# Patient Record
Sex: Female | Born: 1965
Health system: Southern US, Community
[De-identification: ages and names within clinical notes are randomized; demographics above are authoritative.]

## PROBLEM LIST (undated history)

## (undated) DIAGNOSIS — F32A Depression, unspecified: Secondary | ICD-10-CM

## (undated) DIAGNOSIS — K219 Gastro-esophageal reflux disease without esophagitis: Secondary | ICD-10-CM

## (undated) DIAGNOSIS — R32 Unspecified urinary incontinence: Secondary | ICD-10-CM

## (undated) DIAGNOSIS — R51 Headache: Secondary | ICD-10-CM

## (undated) DIAGNOSIS — R519 Headache, unspecified: Secondary | ICD-10-CM

## (undated) DIAGNOSIS — F329 Major depressive disorder, single episode, unspecified: Secondary | ICD-10-CM

## (undated) DIAGNOSIS — IMO0002 Reserved for concepts with insufficient information to code with codable children: Secondary | ICD-10-CM

## (undated) DIAGNOSIS — M329 Systemic lupus erythematosus, unspecified: Secondary | ICD-10-CM

## (undated) DIAGNOSIS — Z86010 Personal history of colonic polyps: Secondary | ICD-10-CM

## (undated) DIAGNOSIS — G4730 Sleep apnea, unspecified: Secondary | ICD-10-CM

## (undated) DIAGNOSIS — T7840XA Allergy, unspecified, initial encounter: Secondary | ICD-10-CM

## (undated) DIAGNOSIS — F419 Anxiety disorder, unspecified: Secondary | ICD-10-CM

## (undated) HISTORY — PX: ANKLE SURGERY: SHX546

## (undated) HISTORY — DX: Allergy, unspecified, initial encounter: T78.40XA

## (undated) HISTORY — DX: Personal history of colonic polyps: Z86.010

## (undated) HISTORY — DX: Headache, unspecified: R51.9

## (undated) HISTORY — PX: FOOT SURGERY: SHX648

## (undated) HISTORY — PX: WISDOM TOOTH EXTRACTION: SHX21

## (undated) HISTORY — DX: Unspecified urinary incontinence: R32

## (undated) HISTORY — DX: Systemic lupus erythematosus, unspecified: M32.9

## (undated) HISTORY — DX: Major depressive disorder, single episode, unspecified: F32.9

## (undated) HISTORY — PX: CHOLECYSTECTOMY: SHX55

## (undated) HISTORY — DX: Anxiety disorder, unspecified: F41.9

## (undated) HISTORY — PX: EYE SURGERY: SHX253

## (undated) HISTORY — PX: MOUTH SURGERY: SHX715

## (undated) HISTORY — DX: Depression, unspecified: F32.A

## (undated) HISTORY — DX: Headache: R51

## (undated) HISTORY — DX: Gastro-esophageal reflux disease without esophagitis: K21.9

## (undated) HISTORY — DX: Sleep apnea, unspecified: G47.30

## (undated) HISTORY — DX: Reserved for concepts with insufficient information to code with codable children: IMO0002

---

## 2004-09-26 ENCOUNTER — Emergency Department (HOSPITAL_COMMUNITY): Admission: EM | Admit: 2004-09-26 | Discharge: 2004-09-26 | Payer: Self-pay | Admitting: Family Medicine

## 2004-10-05 ENCOUNTER — Ambulatory Visit: Payer: Self-pay | Admitting: Pulmonary Disease

## 2005-04-11 ENCOUNTER — Emergency Department (HOSPITAL_COMMUNITY): Admission: EM | Admit: 2005-04-11 | Discharge: 2005-04-11 | Payer: Self-pay | Admitting: Family Medicine

## 2007-12-30 ENCOUNTER — Emergency Department (HOSPITAL_COMMUNITY): Admission: EM | Admit: 2007-12-30 | Discharge: 2007-12-30 | Payer: Self-pay | Admitting: Family Medicine

## 2008-05-14 ENCOUNTER — Emergency Department (HOSPITAL_COMMUNITY): Admission: EM | Admit: 2008-05-14 | Discharge: 2008-05-14 | Payer: Self-pay | Admitting: Family Medicine

## 2008-05-20 ENCOUNTER — Encounter: Admission: RE | Admit: 2008-05-20 | Discharge: 2008-05-20 | Payer: Self-pay | Admitting: Orthopedic Surgery

## 2010-03-16 ENCOUNTER — Encounter: Admission: RE | Admit: 2010-03-16 | Discharge: 2010-03-16 | Payer: Self-pay | Admitting: Obstetrics and Gynecology

## 2010-09-07 ENCOUNTER — Encounter: Admission: RE | Admit: 2010-09-07 | Discharge: 2010-09-07 | Payer: Self-pay | Admitting: Obstetrics and Gynecology

## 2011-07-22 LAB — POCT I-STAT, CHEM 8
BUN: 6
Calcium, Ion: 0.93 — ABNORMAL LOW
Chloride: 105
Creatinine, Ser: 0.7

## 2011-07-22 LAB — D-DIMER, QUANTITATIVE: D-Dimer, Quant: 0.48

## 2011-09-25 ENCOUNTER — Encounter: Payer: Self-pay | Admitting: *Deleted

## 2011-09-25 ENCOUNTER — Emergency Department (HOSPITAL_COMMUNITY)
Admission: EM | Admit: 2011-09-25 | Discharge: 2011-09-25 | Disposition: A | Discharge status: Home / Self Care | Payer: Self-pay | Source: Home / Self Care | Attending: Emergency Medicine | Admitting: Emergency Medicine

## 2011-09-25 DIAGNOSIS — J069 Acute upper respiratory infection, unspecified: Secondary | ICD-10-CM

## 2011-09-25 DIAGNOSIS — J029 Acute pharyngitis, unspecified: Secondary | ICD-10-CM

## 2011-09-25 NOTE — ED Provider Notes (Signed)
History     CSN: [id number] Arrival date & time: 09/25/2011  9:22 AM   First MD Initiated Contact with Patient 09/25/11 [phone]      Chief Complaint  Patient presents with  . Sore Throat    pt with onset of sorethroat thursday - no improvement taking otc meds     (Consider location/radiation/quality/duration/timing/severity/associated sxs/prior treatment) Patient is a 45 y.o. female presenting with pharyngitis. The history is provided by the patient.  Sore Throat This is a new problem. Episode onset: 4 days ago. The problem occurs constantly. The problem has been gradually worsening. Pertinent negatives include no headaches and no shortness of breath. Associated symptoms comments: Associated with nasal congestion, mild cough, ear pressure. The symptoms are aggravated by swallowing. The symptoms are relieved by nothing. Treatments tried: otc cold medicine. The treatment provided no relief.    History reviewed. No pertinent past medical history.  Past Surgical History  Procedure Date  . Cholecystectomy     History reviewed. No pertinent family history.  History  Substance Use Topics  . Smoking status: Not on file  . Smokeless tobacco: Not on file  . Alcohol Use:     OB History    Grav Para Term Preterm Abortions TAB SAB Ect Mult Living                  Review of Systems  Constitutional: Negative for fever and chills.  HENT: Positive for ear pain, congestion, sore throat, rhinorrhea and postnasal drip. Negative for sinus pressure.   Respiratory: Positive for cough. Negative for shortness of breath and wheezing.   Neurological: Negative for headaches.    Allergies  Review of patient's allergies indicates no known allergies.  Home Medications   Current Outpatient Rx  Name Route Sig Dispense Refill  . GUAIFENESIN ER 600 MG PO TB12 Oral Take 1,200 mg by mouth 2 (two) times daily.      . IBUPROFEN 400 MG PO TABS Oral Take 400 mg by mouth every 6 (six) hours as needed.       Marland Kitchen OVER THE COUNTER MEDICATION  Flu med     . PRESCRIPTION MEDICATION  Birth control pill       BP 154/95  Pulse 86  Temp(Src) 98.2 F (36.8 C) (Oral)  Resp 17  SpO2 99%  LMP 09/04/2011  Physical Exam  Constitutional: She appears well-developed and well-nourished. No distress.  HENT:  Right Ear: External ear and ear canal normal. Tympanic membrane is retracted.  Left Ear: External ear and ear canal normal. Tympanic membrane is retracted.  Nose: Mucosal edema and rhinorrhea present.  Mouth/Throat: Mucous membranes are normal. Posterior oropharyngeal erythema present. No oropharyngeal exudate or posterior oropharyngeal edema.  Cardiovascular: Normal rate and regular rhythm.   Pulmonary/Chest: Effort normal and breath sounds normal.  Lymphadenopathy:       Head (right side): Submandibular adenopathy present.       Head (left side): Submandibular adenopathy present.  Skin: Skin is warm and dry. No rash noted.    ED Course  Procedures (including critical care time)  Labs Reviewed - No data to display No results found.   No diagnosis found.    MDM            Cathlyn Parsons, NP 09/25/11 1004

## 2011-09-30 NOTE — ED Provider Notes (Signed)
Dx Jane Valdez, pharyngitis Medical screening examination/treatment/procedure(s) were performed by non-physician practitioner and as supervising physician I was immediately available for consultation/collaboration.  Luiz Blare MD   Luiz Blare, MD 09/30/11 [phone]

## 2014-02-28 ENCOUNTER — Ambulatory Visit (INDEPENDENT_AMBULATORY_CARE_PROVIDER_SITE_OTHER): Payer: 59 | Admitting: Family Medicine

## 2014-02-28 ENCOUNTER — Encounter: Payer: Self-pay | Admitting: Family Medicine

## 2014-02-28 VITALS — BP 120/82 | HR 97 | Temp 98.0°F | Ht 64.0 in | Wt 196.8 lb

## 2014-02-28 DIAGNOSIS — Z136 Encounter for screening for cardiovascular disorders: Secondary | ICD-10-CM

## 2014-02-28 DIAGNOSIS — K219 Gastro-esophageal reflux disease without esophagitis: Secondary | ICD-10-CM | POA: Insufficient documentation

## 2014-02-28 DIAGNOSIS — Z6833 Body mass index (BMI) 33.0-33.9, adult: Secondary | ICD-10-CM | POA: Insufficient documentation

## 2014-02-28 DIAGNOSIS — IMO0002 Reserved for concepts with insufficient information to code with codable children: Secondary | ICD-10-CM

## 2014-02-28 DIAGNOSIS — G4700 Insomnia, unspecified: Secondary | ICD-10-CM

## 2014-02-28 DIAGNOSIS — Z0000 Encounter for general adult medical examination without abnormal findings: Secondary | ICD-10-CM | POA: Insufficient documentation

## 2014-02-28 DIAGNOSIS — F3289 Other specified depressive episodes: Secondary | ICD-10-CM

## 2014-02-28 DIAGNOSIS — F17200 Nicotine dependence, unspecified, uncomplicated: Secondary | ICD-10-CM

## 2014-02-28 DIAGNOSIS — F32A Depression, unspecified: Secondary | ICD-10-CM

## 2014-02-28 DIAGNOSIS — E669 Obesity, unspecified: Secondary | ICD-10-CM

## 2014-02-28 DIAGNOSIS — F329 Major depressive disorder, single episode, unspecified: Secondary | ICD-10-CM

## 2014-02-28 DIAGNOSIS — Z720 Tobacco use: Secondary | ICD-10-CM

## 2014-02-28 DIAGNOSIS — M329 Systemic lupus erythematosus, unspecified: Secondary | ICD-10-CM

## 2014-02-28 LAB — COMPREHENSIVE METABOLIC PANEL
ALK PHOS: 77 U/L (ref 39–117)
ALT: 14 U/L (ref 0–35)
AST: 16 U/L (ref 0–37)
Albumin: 4.1 g/dL (ref 3.5–5.2)
BILIRUBIN TOTAL: 0.6 mg/dL (ref 0.2–1.2)
BUN: 9 mg/dL (ref 6–23)
CO2: 24 mEq/L (ref 19–32)
Calcium: 9.2 mg/dL (ref 8.4–10.5)
Chloride: 103 mEq/L (ref 96–112)
Creat: 0.63 mg/dL (ref 0.50–1.10)
Glucose, Bld: 82 mg/dL (ref 70–99)
Potassium: 4.2 mEq/L (ref 3.5–5.3)
SODIUM: 136 meq/L (ref 135–145)
TOTAL PROTEIN: 6.7 g/dL (ref 6.0–8.3)

## 2014-02-28 LAB — CBC WITH DIFFERENTIAL/PLATELET
BASOS ABS: 0 10*3/uL (ref 0.0–0.1)
BASOS PCT: 0 % (ref 0–1)
EOS ABS: 0.1 10*3/uL (ref 0.0–0.7)
Eosinophils Relative: 1 % (ref 0–5)
HCT: 41 % (ref 36.0–46.0)
Hemoglobin: 14.3 g/dL (ref 12.0–15.0)
LYMPHS ABS: 2.5 10*3/uL (ref 0.7–4.0)
Lymphocytes Relative: 30 % (ref 12–46)
MCH: 32.9 pg (ref 26.0–34.0)
MCHC: 34.9 g/dL (ref 30.0–36.0)
MCV: 94.3 fL (ref 78.0–100.0)
Monocytes Absolute: 0.7 10*3/uL (ref 0.1–1.0)
Monocytes Relative: 9 % (ref 3–12)
NEUTROS PCT: 60 % (ref 43–77)
Neutro Abs: 4.9 10*3/uL (ref 1.7–7.7)
PLATELETS: 280 10*3/uL (ref 150–400)
RBC: 4.35 MIL/uL (ref 3.87–5.11)
RDW: 12.4 % (ref 11.5–15.5)
WBC: 8.2 10*3/uL (ref 4.0–10.5)

## 2014-02-28 LAB — LIPID PANEL
CHOL/HDL RATIO: 3 ratio
Cholesterol: 157 mg/dL (ref 0–200)
HDL: 52 mg/dL (ref >39)
LDL CALC: 90 mg/dL (ref 0–99)
Triglycerides: 76 mg/dL (ref <150)
VLDL: 15 mg/dL (ref 0–40)

## 2014-02-28 MED ORDER — VARENICLINE TARTRATE 0.5 MG X 11 & 1 MG X 42 PO MISC
ORAL | Status: DC
Start: 2014-02-28 — End: 2014-04-02

## 2014-02-28 MED ORDER — TRAZODONE HCL 50 MG PO TABS: 25.0000 mg | ORAL_TABLET | Freq: Every evening | ORAL | Status: DC | PRN

## 2014-02-28 NOTE — Assessment & Plan Note (Signed)
She will return to discuss this- wants to work on her diet and exercise.

## 2014-02-28 NOTE — Assessment & Plan Note (Signed)
Ready to quit! Discussed options.  She would like to try chantix.  Rx given.

## 2014-02-28 NOTE — Assessment & Plan Note (Signed)
  The problem of recurrent insomnia is discussed. Avoidance of caffeine sources is strongly encouraged. Sleep hygiene issues are reviewed.  Start Trazodone prn insomnia.

## 2014-02-28 NOTE — Progress Notes (Signed)
Pre visit review using our clinic review tool, if applicable. No additional management support is needed unless otherwise documented below in the visit note. 

## 2014-02-28 NOTE — Patient Instructions (Addendum)
Nice to meet you. We are trazodone 25-50 mg nightly as needed for sleep.  We will call you with your lab results and you can view them online.  I'm proud of you for wanting to quit smoking.

## 2014-02-28 NOTE — Assessment & Plan Note (Addendum)
Reviewed preventive care protocols, scheduled due services, and updated immunizations Discussed nutrition, exercise, diet, and healthy lifestyle.  Orders Placed This Encounter  Procedures  . Hepatitis C Antibody  . CBC with Differential  . Comprehensive metabolic panel  . Lipid panel  . TSH

## 2014-02-28 NOTE — Progress Notes (Signed)
Subjective:   Patient ID: ILEIGH METTLER, female    DOB: 1966-08-09, 48 y.o.   MRN: [id number]  LAJADA JANES is a pleasant 48 y.o. year old female who presents to clinic today with [place]  on 02/28/2014  HPI:  G2P2- would like CPX as well today. Has OBGYN- Dr. Harrington [facility]allenger [facility])- has appt in June.  Insomnia- for years, difficulty staying asleep.  No trouble falling asleep. Takes Melatonin, tylenol pm, benadryl prn insomnia.  They have minimal benefit. Tries lunesta in past, not helpful.  Does have some hip pain at night over past year or two. Does snore and wakes herself up.  Husband does not think she is not having apnea- she thinks it is her sinuses.  Lupus- per pt, only "skin lupus was ruled out in my blood." Dermatologist prescribes topical ointments (Dr. Allyson Sabal).   Tobacco abuse- 1/2 ppd.  Has tried Zyban in past.  She is ready to quit.  Obesity- wants to lose weight.  Admits to not being as active.  Patient Active Problem List   Diagnosis Date Noted  . Depression 02/28/2014  . Lupus 02/28/2014  . GERD (gastroesophageal reflux disease) 02/28/2014  . Insomnia 02/28/2014   Past Medical History  Diagnosis Date  . Depression   . Frequent headaches   . GERD (gastroesophageal reflux disease)   . Lupus     skin lupus   Past Surgical History  Procedure Laterality Date  . Cholecystectomy     History  Substance Use Topics  . Smoking status: Current Every Day Smoker  . Smokeless tobacco: Never Used  . Alcohol Use: Yes   Family History  Problem Relation Age of Onset  . Hypertension Mother   . Hyperlipidemia Father   . Heart disease Father   . Diabetes Paternal Grandmother    No Known Allergies Current Outpatient Prescriptions on File Prior to Visit  Medication Sig Dispense Refill  . guaiFENesin (MUCINEX) 600 MG 12 hr tablet Take 1,200 mg by mouth 2 (two) times daily.        Marland Kitchen ibuprofen (ADVIL,MOTRIN) 400 MG tablet Take 400 mg by mouth every 6 (six)  hours as needed.         No current facility-administered medications on file prior to visit.   The PMH, PSH, Social History, Family History, Medications, and allergies have been reviewed in [facility], and have been updated if relevant.   Review of Systems    See HPI Denies CP or SOB No anxiety or depression No blood in stool, no changes in bowel habits No n/v/d Objective:    BP 120/82  Pulse 97  Temp(Src) 98 F (36.7 C) (Oral)  Ht 5\' 4"  (1.626 m)  Wt 196 lb 12 oz (89.245 kg)  BMI 33.76 kg/m2  SpO2 97%   Physical Exam   General:  Well-developed,well-nourished,in no acute distress; alert,appropriate and cooperative throughout examination Head:  normocephalic and atraumatic.   Eyes:  vision grossly intact, pupils equal, pupils round, and pupils reactive to light.   Ears:  R ear normal and L ear normal.   Nose:  no external deformity.   Lungs:  Normal respiratory effort, chest expands symmetrically. Lungs are clear to auscultation, no crackles or wheezes. Heart:  Normal rate and regular rhythm. S1 and S2 normal without gallop, murmur, click, rub or other extra sounds. Abdomen:  Bowel sounds positive,abdomen soft and non-tender without masses, organomegaly or hernias noted. Msk:  No deformity or scoliosis noted of thoracic or  lumbar spine.   Extremities:  No clubbing, cyanosis, edema, or deformity noted with normal full range of motion of all joints.   Neurologic:  alert & oriented X3 and gait normal.   Skin:  Intact without suspicious lesions or rashes Cervical Nodes:  No lymphadenopathy noted Axillary Nodes:  No palpable lymphadenopathy Psych:  Cognition and judgment appear intact. Alert and cooperative with normal attention span and concentration. No apparent delusions, illusions, hallucinations       Assessment & Plan:   Depression  Lupus  GERD (gastroesophageal reflux disease)  Insomnia No Follow-up on file.

## 2014-03-01 ENCOUNTER — Telehealth: Payer: Self-pay | Admitting: Family Medicine

## 2014-03-01 LAB — HEPATITIS C ANTIBODY: HCV Ab: NEGATIVE

## 2014-03-01 LAB — TSH: TSH: 1.559 u[IU]/mL (ref 0.350–4.500)

## 2014-03-01 NOTE — Telephone Encounter (Signed)
Relevant patient education assigned to patient using Emmi. ° °

## 2014-03-03 ENCOUNTER — Encounter: Payer: Self-pay | Admitting: *Deleted

## 2014-03-27 ENCOUNTER — Other Ambulatory Visit: Payer: Self-pay | Admitting: Obstetrics and Gynecology

## 2014-03-27 DIAGNOSIS — R928 Other abnormal and inconclusive findings on diagnostic imaging of breast: Secondary | ICD-10-CM

## 2014-04-02 ENCOUNTER — Other Ambulatory Visit: Payer: Self-pay | Admitting: Family Medicine

## 2014-04-02 NOTE — Telephone Encounter (Signed)
Pt requesting medication refill. Pt estab 02/2014. pls advise

## 2014-04-03 ENCOUNTER — Other Ambulatory Visit: Payer: Self-pay | Admitting: *Deleted

## 2014-04-03 MED ORDER — VARENICLINE TARTRATE 1 MG PO TABS: 1.0000 mg | ORAL_TABLET | Freq: Two times a day (BID) | ORAL | Status: DC

## 2014-04-03 NOTE — Telephone Encounter (Signed)
Pharmacy received rx for Chantix starter pack. Pt had already finished starter pack and needs a new rx for the continuation therapy.

## 2014-04-04 ENCOUNTER — Ambulatory Visit
Admission: RE | Admit: 2014-04-04 | Discharge: 2014-04-04 | Disposition: A | Discharge status: Home / Self Care | Payer: 59 | Source: Ambulatory Visit | Attending: Obstetrics and Gynecology | Admitting: Obstetrics and Gynecology

## 2014-04-04 ENCOUNTER — Encounter (INDEPENDENT_AMBULATORY_CARE_PROVIDER_SITE_OTHER): Payer: Self-pay

## 2014-04-04 DIAGNOSIS — R928 Other abnormal and inconclusive findings on diagnostic imaging of breast: Secondary | ICD-10-CM

## 2014-04-28 ENCOUNTER — Encounter: Payer: Self-pay | Admitting: Family Medicine

## 2014-04-28 ENCOUNTER — Ambulatory Visit (INDEPENDENT_AMBULATORY_CARE_PROVIDER_SITE_OTHER): Payer: 59 | Admitting: Family Medicine

## 2014-04-28 VITALS — BP 118/76 | HR 88 | Temp 98.0°F | Ht 63.75 in | Wt 188.2 lb

## 2014-04-28 DIAGNOSIS — E669 Obesity, unspecified: Secondary | ICD-10-CM

## 2014-04-28 MED ORDER — PHENTERMINE HCL 15 MG PO CAPS: 15.0000 mg | ORAL_CAPSULE | ORAL | Status: DC

## 2014-04-28 NOTE — Progress Notes (Signed)
Pre visit review using our clinic review tool, if applicable. No additional management support is needed unless otherwise documented below in the visit note. 

## 2014-04-28 NOTE — Patient Instructions (Signed)
Great to see you. We will call you with a referral to nutritionist.  We are starting phentermine 15 mg every morning- come see me in 1 month.

## 2014-04-28 NOTE — Progress Notes (Signed)
   Subjective:   Patient ID: Jane Valdez, female    DOB: 04-Apr-1966, 48 y.o.   MRN: [id number]  Jane Valdez is a pleasant 48 y.o. year old female who presents to clinic today with Weight Loss  on 04/28/2014  HPI:  Has been having weight for years.  Since May, has been cutting calories- 1000 to 1200 calories a day.  Exercising- walking every day, 1 mile. Has lost weight.   Wt Readings from Last 3 Encounters:  04/28/14 188 lb 4 oz (85.39 kg)  02/28/14 196 lb 12 oz (89.245 kg)   Has tried "everything,"  Weight watchers, slim fast, metabolic weight loss.  Nothing has worked.  Current Outpatient Prescriptions on File Prior to Visit  Medication Sig Dispense Refill  . cycloSPORINE (RESTASIS) 0.05 % ophthalmic emulsion 1 drop 2 (two) times daily.      . fluocinonide (LIDEX) 0.05 % external solution Apply 1 application topically 2 (two) times daily.      Marland Kitchen guaiFENesin (MUCINEX) 600 MG 12 hr tablet Take 1,200 mg by mouth 2 (two) times daily.        Marland Kitchen ibuprofen (ADVIL,MOTRIN) 400 MG tablet Take 400 mg by mouth every 6 (six) hours as needed.        . norethindrone (MICRONOR,CAMILA,ERRIN) 0.35 MG tablet Take 1 tablet by mouth daily.      . traZODone (DESYREL) 50 MG tablet Take 0.5-1 tablets (25-50 mg total) by mouth at bedtime as needed for sleep.  30 tablet  3  . varenicline (CHANTIX CONTINUING MONTH PAK) 1 MG tablet Take 1 tablet (1 mg total) by mouth 2 (two) times daily.  60 tablet  3   No current facility-administered medications on file prior to visit.    No Known Allergies  Past Medical History  Diagnosis Date  . Depression   . Frequent headaches   . GERD (gastroesophageal reflux disease)   . Lupus     skin lupus    Past Surgical History  Procedure Laterality Date  . Cholecystectomy      Family History  Problem Relation Age of Onset  . Hypertension Mother   . Hyperlipidemia Father   . Heart disease Father   . Diabetes Paternal Grandmother     History   Social  History  . Marital Status: Married    Spouse Name: N/A    Number of Children: N/A  . Years of Education: N/A   Occupational History  . Not on file.   Social History Main Topics  . Smoking status: Current Some Day Smoker  . Smokeless tobacco: Never Used  . Alcohol Use: Yes  . Drug Use: No  . Sexual Activity: Yes   Other Topics Concern  . Not on file   Social History Narrative  . No narrative on file   The PMH, PSH, Social History, Family History, Medications, and allergies have been reviewed in [facility], and have been updated if relevant.   Review of Systems    See HPI Denies CP, SOB or blurred vision Objective:    BP 118/76  Pulse 88  Temp(Src) 98 F (36.7 C) (Oral)  Ht 5' 3.75" (1.619 m)  Wt 188 lb 4 oz (85.39 kg)  BMI 32.58 kg/m2  SpO2 98%   Physical Exam  Gen:  Alert, pleasant, NAD Psych:  Good eye contact, not anxious or depressed appearing      Assessment & Plan:   Obesity, unspecified No Follow-up on file.

## 2014-04-28 NOTE — Assessment & Plan Note (Signed)
>  15 minutes spent in face to face time with patient, >50% spent in counselling or coordination of care. Discussed weight loss plan. She would like to work with nutritionist  Pt would also like to discuss medication options- discussed phentermine risk benefits, side effects including HTN, pulmonary HTN, stroke.    She would like to start phentermine and lifestyle changes.  Follow up in 1 month.  If BMI < 27 will decrease to half dose x 1 month then stop

## 2014-06-05 ENCOUNTER — Encounter: Payer: Self-pay | Admitting: Dietician

## 2014-06-05 ENCOUNTER — Encounter: Payer: 59 | Attending: Family Medicine | Admitting: Dietician

## 2014-06-05 VITALS — Ht 64.0 in | Wt 181.6 lb

## 2014-06-05 DIAGNOSIS — Z6831 Body mass index (BMI) 31.0-31.9, adult: Secondary | ICD-10-CM | POA: Diagnosis not present

## 2014-06-05 DIAGNOSIS — Z713 Dietary counseling and surveillance: Secondary | ICD-10-CM | POA: Insufficient documentation

## 2014-06-05 DIAGNOSIS — E669 Obesity, unspecified: Secondary | ICD-10-CM | POA: Diagnosis present

## 2014-06-05 NOTE — Progress Notes (Signed)
Medical Nutrition Therapy:  Appt start time: 1400 end time:  1500.   Assessment:  Primary concerns today: Jane Valdez is here today for weight management.  She has been making changes on her own and working with her doctor.  She took phentermine for one month but she didn't feel many effects and couldn't sleep well.  She has also tried Weight Watchers and similar programs in the past but has gained her weight back once she was off the program.  She states her goal weight is 135 lbs.  Today she weighs 181.6 lbs, down from 188 lbs on her doctor's visit 04/28/2014 and down 15.4 lbs from her starting weight of 197 lbs on 03/13/14.  Her 30th year anniversary is coming up in December and that is her motivation but she does want to achieve sustainable weight loss.  She quit smoking one month ago and is still on Chantix.  She is somewhat discouraged by the "slow rate" of weight loss she is experiencing.  She works for [employer] and lives with her husband who is out of town a lot.  Her kids are grown and out of the house.  She does most of the food shopping and cooking.  She states her favorite foods are [place] foods like pasta and bread and she states they grill out a lot too. Changes she has made include cutting out sodas (1 diet rite a day), healthier breakfast, keeping her calories to 1200 calories or less a day using MyFitnessPal, and exercising more.  She feels that her dinner meals could be healthier.  She states they go out to eat once a week on Sundays to [place] or [place].    Learning Readiness:  Change in progress  MEDICATIONS: see list   DIETARY INTAKE:  Usual eating pattern includes 3 meals and 2-3 snacks per day.   24-hr recall:  B (7:30-8 AM): at work eats 1 berry flavored or maple and brown sugar instant oatmeal packet, OR special K cereal with skim milk and black coffee Snk (10:30 AM): roasted, salted almonds 12-24  L (12 PM): [place] sandwich with tomato or tomato sandwich with mayo and whole  wheat bread, or a peanut butter and honey sandwich, sunchips or celery sticks Snk ( PM): apple or banana or pear D ( PM): grilled chicken or steak, bruchetta and salad, chicken and pasta, spaghetti  Snk ( PM): sometimes 100 calorie pack of popcorn, or whipped greek yogurt, or one spoonful of frozen yogurt or weight watcher popscicle Beverages: water, 1 diet rite soda at dinner, black coffee, skim milk  Usual physical activity: 30 minutes of cardio at least 4-5 days a week on her lunch hour for the past 2-3 weeks. She does the stationary bike 15 min and elyptical for 15 min.  Sometimes treadmill and stepper.   Progress Towards Goal(s):  Some progress.   Nutritional Diagnosis:  Olla-3.3 Overweight/obesity As related to preference for starchy foods and history of yoyo dieting.  As evidenced by patient report, dietary recall, and BMI of 31.2.    Intervention:  Nutrition counseling on weight management.  Praised Faelynn for the changes she has made so far and her progress of 15 lb weight loss in 3 months.  Outlined to her that 3500 calorie reduction equals 1 lb of weight loss and that healthy, sustainable weight loss is 0.5-2 lbs a week.  Her weight loss trend falls within these guidelines and I encouraged her to stay motivated and positive as she continues to  lose weight at this healthy rate.  I also explained that incorporating healthy eating habits slowly and not all at once helps with maintaining her weight once she has reached her goal, unlike the yoyo dieting she has experienced in the past.  Discussed aspects of a well-balanced meal using the MyPlate method.  Demonstrated portion sizes and suggested she measure out some of her food servings so that she is accurately entering this data into her calorie counter app on her phone.  Encouraged her to stay consistent with her exercise and increase her activity slowly over time. Together we set the following goals:  Weigh yourself only once a week on the same  scale at the same time of day. Choose a lean protein food at each meal and snack. Aim for 1-2 lbs of weight loss each week following your 1200 calorie plan. Continue to eat regular meals and snacks every 3-5 hours. Keep up the regular exercise.  Aim for 30 minutes 5-7 days a week.  Teaching Method Utilized all of the following: Visual Auditory Hands on  Barriers to learning/adherence to lifestyle change: none  Demonstrated degree of understanding via:  Teach Back   Monitoring/Evaluation:  Dietary intake, exercise, and body weight prn.

## 2014-06-05 NOTE — Patient Instructions (Addendum)
Weigh yourself only once a week on the same scale at the same time of day. Choose a lean protein food at each meal and snack. Aim for 1-2 lbs of weight loss each week following your 1200 calorie plan. Continue to eat regular meals and snacks every 3-5 hours. Keep up the regular exercise.  Aim for 30 minutes 5-7 days a week.

## 2014-08-08 ENCOUNTER — Other Ambulatory Visit: Payer: Self-pay

## 2014-09-08 ENCOUNTER — Other Ambulatory Visit: Payer: Self-pay | Admitting: Obstetrics and Gynecology

## 2014-09-08 DIAGNOSIS — N6002 Solitary cyst of left breast: Secondary | ICD-10-CM

## 2014-10-06 ENCOUNTER — Ambulatory Visit
Admission: RE | Admit: 2014-10-06 | Discharge: 2014-10-06 | Disposition: A | Discharge status: Home / Self Care | Payer: 59 | Source: Ambulatory Visit | Attending: Obstetrics and Gynecology | Admitting: Obstetrics and Gynecology

## 2014-10-06 DIAGNOSIS — N6002 Solitary cyst of left breast: Secondary | ICD-10-CM

## 2015-02-23 ENCOUNTER — Other Ambulatory Visit: Payer: Self-pay

## 2015-02-24 ENCOUNTER — Other Ambulatory Visit: Payer: Self-pay | Admitting: Family Medicine

## 2015-02-24 ENCOUNTER — Other Ambulatory Visit (INDEPENDENT_AMBULATORY_CARE_PROVIDER_SITE_OTHER): Payer: 59

## 2015-02-24 DIAGNOSIS — Z0000 Encounter for general adult medical examination without abnormal findings: Secondary | ICD-10-CM

## 2015-02-25 LAB — CBC WITH DIFFERENTIAL/PLATELET
BASOS ABS: 0.2 10*3/uL — AB (ref 0.0–0.1)
Basophils Relative: 2.2 % (ref 0.0–3.0)
Eosinophils Absolute: 0.2 10*3/uL (ref 0.0–0.7)
Eosinophils Relative: 2 % (ref 0.0–5.0)
HCT: 39 % (ref 36.0–46.0)
HEMOGLOBIN: 13.4 g/dL (ref 12.0–15.0)
LYMPHS PCT: 32.7 % (ref 12.0–46.0)
Lymphs Abs: 3 10*3/uL (ref 0.7–4.0)
MCHC: 34.4 g/dL (ref 30.0–36.0)
MCV: 93.6 fl (ref 78.0–100.0)
MONOS PCT: 6.3 % (ref 3.0–12.0)
Monocytes Absolute: 0.6 10*3/uL (ref 0.1–1.0)
Neutro Abs: 5.2 10*3/uL (ref 1.4–7.7)
Neutrophils Relative %: 56.8 % (ref 43.0–77.0)
PLATELETS: 239 10*3/uL (ref 150.0–400.0)
RBC: 4.17 Mil/uL (ref 3.87–5.11)
RDW: 12.3 % (ref 11.5–15.5)
WBC: 9.1 10*3/uL (ref 4.0–10.5)

## 2015-02-25 LAB — TSH: TSH: 1.69 u[IU]/mL (ref 0.35–4.50)

## 2015-02-25 LAB — COMPREHENSIVE METABOLIC PANEL
ALK PHOS: 62 U/L (ref 39–117)
ALT: 12 U/L (ref 0–35)
AST: 14 U/L (ref 0–37)
Albumin: 3.8 g/dL (ref 3.5–5.2)
BUN: 13 mg/dL (ref 6–23)
CALCIUM: 9.1 mg/dL (ref 8.4–10.5)
CHLORIDE: 102 meq/L (ref 96–112)
CO2: 29 mEq/L (ref 19–32)
CREATININE: 0.59 mg/dL (ref 0.40–1.20)
GFR: 115.3 mL/min (ref >60.00)
Glucose, Bld: 89 mg/dL (ref 70–99)
Potassium: 3.8 mEq/L (ref 3.5–5.1)
Sodium: 136 mEq/L (ref 135–145)
Total Bilirubin: 0.3 mg/dL (ref 0.2–1.2)
Total Protein: 6.9 g/dL (ref 6.0–8.3)

## 2015-02-25 LAB — LIPID PANEL
CHOL/HDL RATIO: 3
Cholesterol: 133 mg/dL (ref 0–200)
HDL: 43.9 mg/dL (ref >39.00)
LDL CALC: 71 mg/dL (ref 0–99)
NonHDL: 89.1
Triglycerides: 93 mg/dL (ref 0.0–149.0)
VLDL: 18.6 mg/dL (ref 0.0–40.0)

## 2015-03-03 ENCOUNTER — Encounter: Payer: Self-pay | Admitting: Family Medicine

## 2015-03-03 ENCOUNTER — Ambulatory Visit (INDEPENDENT_AMBULATORY_CARE_PROVIDER_SITE_OTHER): Payer: 59 | Admitting: Family Medicine

## 2015-03-03 VITALS — BP 128/78 | HR 75 | Temp 98.0°F | Ht 63.5 in | Wt 181.2 lb

## 2015-03-03 DIAGNOSIS — F329 Major depressive disorder, single episode, unspecified: Secondary | ICD-10-CM

## 2015-03-03 DIAGNOSIS — Z0000 Encounter for general adult medical examination without abnormal findings: Secondary | ICD-10-CM | POA: Diagnosis not present

## 2015-03-03 DIAGNOSIS — G4700 Insomnia, unspecified: Secondary | ICD-10-CM

## 2015-03-03 DIAGNOSIS — E669 Obesity, unspecified: Secondary | ICD-10-CM | POA: Diagnosis not present

## 2015-03-03 DIAGNOSIS — K219 Gastro-esophageal reflux disease without esophagitis: Secondary | ICD-10-CM

## 2015-03-03 DIAGNOSIS — Z720 Tobacco use: Secondary | ICD-10-CM

## 2015-03-03 DIAGNOSIS — F32A Depression, unspecified: Secondary | ICD-10-CM

## 2015-03-03 MED ORDER — TRAZODONE HCL 50 MG PO TABS: ORAL_TABLET | ORAL | Status: DC

## 2015-03-03 NOTE — Progress Notes (Signed)
Subjective:   Patient ID: Jane Valdez, female    DOB: [birth year], 49 y.o.   MRN: [id number]  Jane Valdez is a pleasant 49 y.o. year old female who presents to clinic today with Annual Exam  on 03/03/2015  HPI:  G2P2 Has OBGYN- Dr. Harrington [facility])- has appt for June 10th. Mammogram 10/06/14, recommended 6 month follow up- already scheduled for June.  Insomnia- for years, difficulty staying asleep.  No trouble falling asleep. Takes Melatonin, tylenol pm, benadryl prn insomnia.  They have minimal benefit. Tries lunesta in past, not helpful.   Started Trazodone last year when she established care with me, felt it did not work so she did not ask for a refill.   Lupus- per pt, only "skin lupus was ruled out in my blood." Dermatologist prescribes topical ointments (Dr. Allyson Sabal).  Obesity- referred her to a nutrionist last year. Lost 20 pounds but has regained some back.  Needs to get motivated to lose weight again.  Wt Readings from Last 3 Encounters:  03/03/15 181 lb 4 oz (82.214 kg)  06/05/14 181 lb 9.6 oz (82.373 kg)  04/28/14 188 lb 4 oz (85.39 kg)     Lab Results  Component Value Date   WBC 9.1 02/24/2015   HGB 13.4 02/24/2015   HCT 39.0 02/24/2015   MCV 93.6 02/24/2015   PLT 239.0 02/24/2015   Lab Results  Component Value Date   CREATININE 0.59 02/24/2015   Lab Results  Component Value Date   TSH 1.69 02/24/2015   Lab Results  Component Value Date   NA 136 02/24/2015   K 3.8 02/24/2015   CL 102 02/24/2015   CO2 29 02/24/2015   Lab Results  Component Value Date   CHOL 133 02/24/2015   HDL 43.90 02/24/2015   LDLCALC 71 02/24/2015   TRIG 93.0 02/24/2015   CHOLHDL 3 02/24/2015     Patient Active Problem List   Diagnosis Date Noted  . Depression 02/28/2014  . Lupus 02/28/2014  . GERD (gastroesophageal reflux disease) 02/28/2014  . Insomnia 02/28/2014  . Routine general medical examination at a health care facility 02/28/2014  . Tobacco  abuse 02/28/2014  . Obesity, unspecified 02/28/2014   Past Medical History  Diagnosis Date  . Depression   . Frequent headaches   . GERD (gastroesophageal reflux disease)   . Lupus     skin lupus   Past Surgical History  Procedure Laterality Date  . Cholecystectomy     History  Substance Use Topics  . Smoking status: Current Some Day Smoker  . Smokeless tobacco: Never Used  . Alcohol Use: Yes   Family History  Problem Relation Age of Onset  . Hypertension Mother   . Hyperlipidemia Father   . Heart disease Father   . Diabetes Paternal Grandmother    No Known Allergies Current Outpatient Prescriptions on File Prior to Visit  Medication Sig Dispense Refill  . fluocinonide (LIDEX) 0.05 % external solution Apply 1 application topically 2 (two) times daily.    Marland Kitchen guaiFENesin (MUCINEX) 600 MG 12 hr tablet Take 1,200 mg by mouth 2 (two) times daily.      . Melatonin 10 MG TABS Take by mouth.    . norethindrone (MICRONOR,CAMILA,ERRIN) 0.35 MG tablet Take 1 tablet by mouth daily.    . cycloSPORINE (RESTASIS) 0.05 % ophthalmic emulsion 1 drop 2 (two) times daily.     No current facility-administered medications on file prior to visit.   The PMH,  PSH, Social History, Family History, Medications, and allergies have been reviewed in [facility], and have been updated if relevant.   Review of Systems  Constitutional: Negative.   HENT: Negative.   Eyes: Negative.   Respiratory: Negative.   Gastrointestinal: Negative.   Endocrine: Negative.   Genitourinary: Positive for urgency. Negative for dysuria, frequency, flank pain, enuresis and dyspareunia.  Musculoskeletal: Negative.   Skin: Negative.   Allergic/Immunologic: Negative.   Neurological: Negative.   Hematological: Negative.   Psychiatric/Behavioral: Negative.   All other systems reviewed and are negative.    Objective:    BP 128/78 mmHg  Pulse 75  Temp(Src) 98 F (36.7 C) (Oral)  Ht 5' 3.5" (1.613 m)  Wt 181 lb 4 oz  (82.214 kg)  BMI 31.60 kg/m2  SpO2 98%   Physical Exam   General:  Well-developed,well-nourished,in no acute distress; alert,appropriate and cooperative throughout examination Head:  normocephalic and atraumatic.   Eyes:  vision grossly intact, pupils equal, pupils round, and pupils reactive to light.   Ears:  R ear normal and L ear normal.   Nose:  no external deformity.   Lungs:  Normal respiratory effort, chest expands symmetrically. Lungs are clear to auscultation, no crackles or wheezes. Heart:  Normal rate and regular rhythm. S1 and S2 normal without gallop, murmur, click, rub or other extra sounds. Abdomen:  Bowel sounds positive,abdomen soft and non-tender without masses, organomegaly or hernias noted. Msk:  No deformity or scoliosis noted of thoracic or lumbar spine.   Extremities:  No clubbing, cyanosis, edema, or deformity noted with normal full range of motion of all joints.   Neurologic:  alert & oriented X3 and gait normal.   Skin:  Intact without suspicious lesions or rashes Cervical Nodes:  No lymphadenopathy noted Axillary Nodes:  No palpable lymphadenopathy Psych:  Cognition and judgment appear intact. Alert and cooperative with normal attention span and concentration. No apparent delusions, illusions, hallucinations       Assessment & Plan:   Routine general medical examination at a health care facility  Tobacco abuse  Depression  Gastroesophageal reflux disease, esophagitis presence not specified No Follow-up on file.

## 2015-03-03 NOTE — Assessment & Plan Note (Signed)
Reviewed preventive care protocols, scheduled due services, and updated immunizations Discussed nutrition, exercise, diet, and healthy lifestyle.  

## 2015-03-03 NOTE — Progress Notes (Signed)
Pre visit review using our clinic review tool, if applicable. No additional management support is needed unless otherwise documented below in the visit note. 

## 2015-03-03 NOTE — Assessment & Plan Note (Signed)
Had improved but "fell off the wagon." Discussed getting motivated to start exercising and counting calories. The patient indicates understanding of these issues and agrees with the plan.

## 2015-03-03 NOTE — Assessment & Plan Note (Signed)
Discussed tx options and sleep hygiene again. We agreed that perhaps higher dose of trazodone may be worth trying- eRx sent for Trazodone 50 mg- 1-2 tables nightly as needed for insomnia.

## 2015-03-18 ENCOUNTER — Other Ambulatory Visit: Payer: Self-pay | Admitting: Obstetrics and Gynecology

## 2015-03-18 DIAGNOSIS — N6002 Solitary cyst of left breast: Secondary | ICD-10-CM

## 2015-04-08 ENCOUNTER — Ambulatory Visit
Admission: RE | Admit: 2015-04-08 | Discharge: 2015-04-08 | Disposition: A | Discharge status: Home / Self Care | Payer: 59 | Source: Ambulatory Visit | Attending: Obstetrics and Gynecology | Admitting: Obstetrics and Gynecology

## 2015-04-08 DIAGNOSIS — N6002 Solitary cyst of left breast: Secondary | ICD-10-CM

## 2015-06-29 ENCOUNTER — Other Ambulatory Visit: Payer: Self-pay | Admitting: Family Medicine

## 2015-07-18 IMAGING — US US BREAST LTD UNI LEFT INC AXILLA
1 series · 4 of 4 positions shown · non-contrast
Comparison: Priors

CLINICAL DATA: Patient for short-term follow-up probably benign
left breast mass.

EXAM:
DIGITAL DIAGNOSTIC BILATERAL MAMMOGRAM WITH 3D TOMOSYNTHESIS WITH
CAD
ULTRASOUND LEFT BREAST

[Series 1: us breast ltd uni left inc axilla · 0.06mm/px · 4 of 4 slices shown]
[im 1/4]
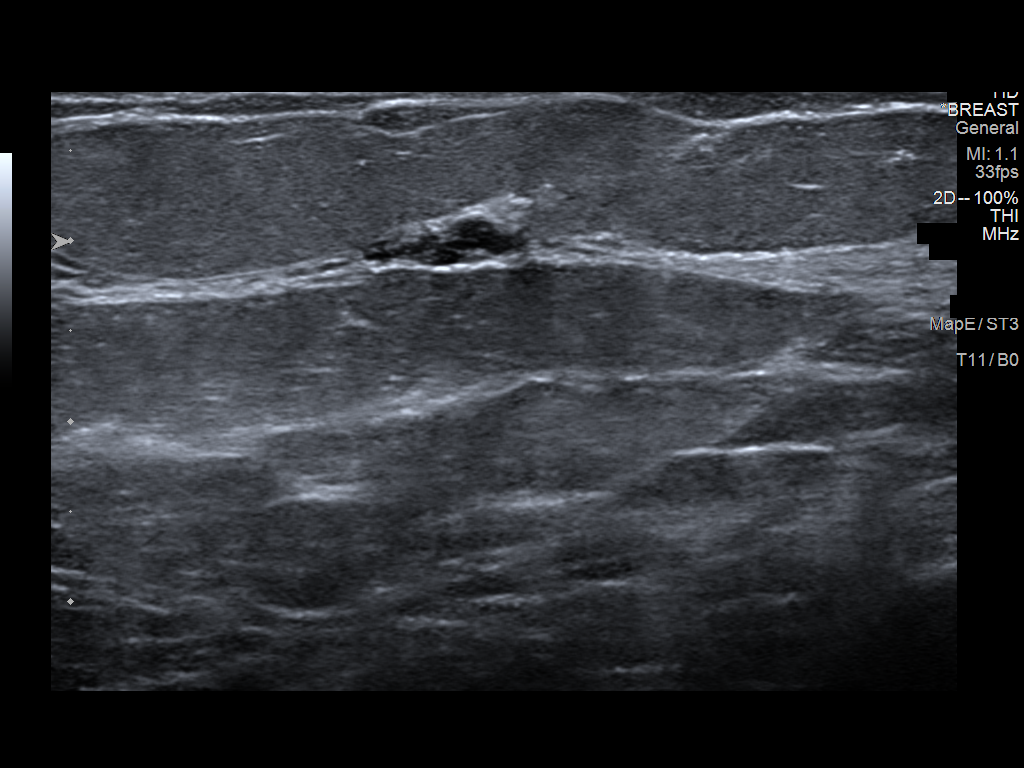
[im 2/4]
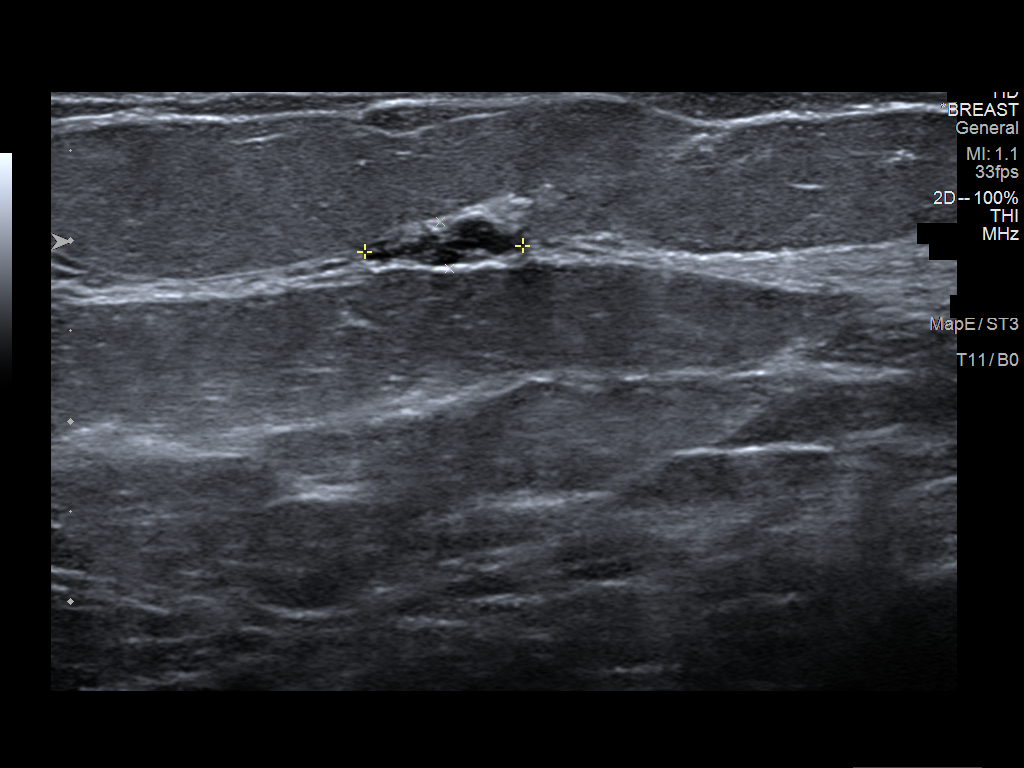
[im 3/4]
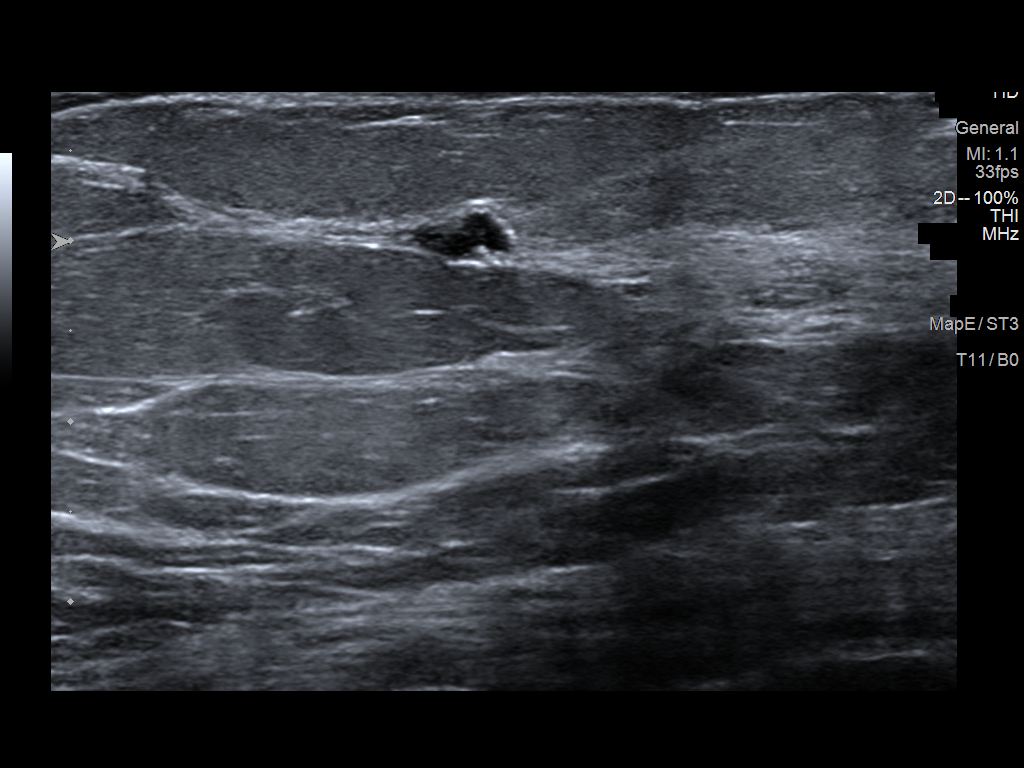
[im 4/4]
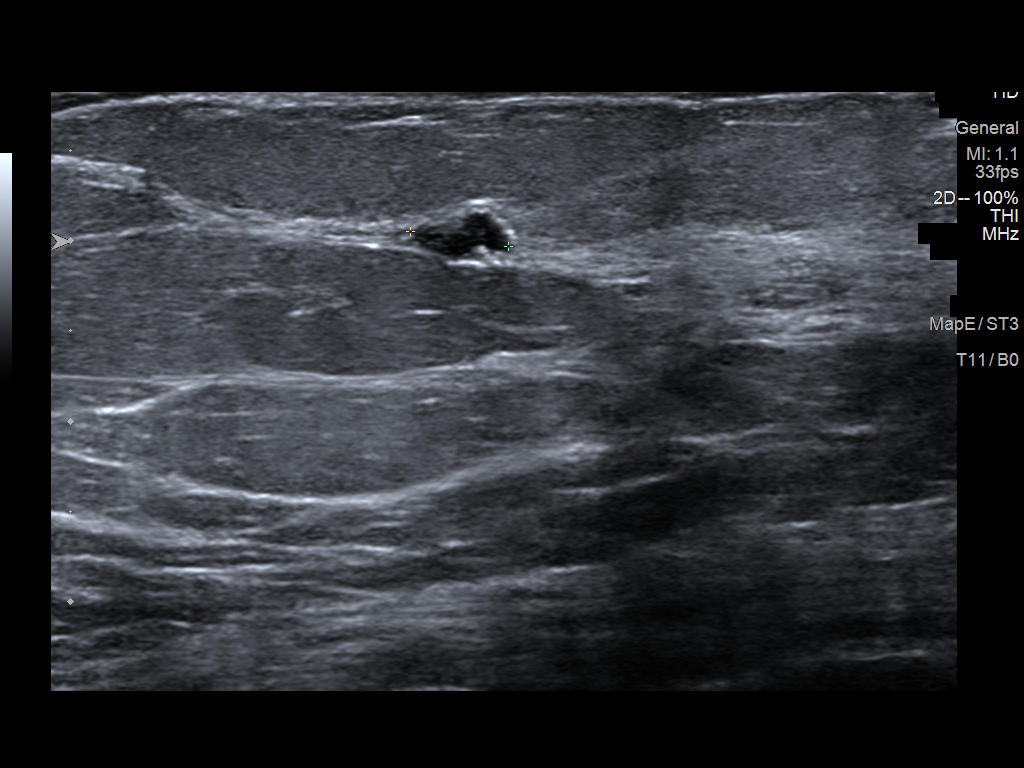

[4 of 4 positions shown; findings below may reference images not displayed]

ACR Breast Density Category c: The breast tissue is heterogeneously
dense, which may obscure small masses.
FINDINGS: No concerning masses, calcifications or architectural distortion
identified within either breast.

Mammographic images were processed with CAD.

On physical exam, I palpate no discrete mass within the upper-outer
left breast.

Targeted ultrasound is performed, showing a 9 x 3 x 6 mm cluster of
cysts within the left breast 2 o'clock position 5 cm from the
nipple, unchanged from prior exams where it measured 13 x 3 x 8 mm.
IMPRESSION: Stable probable cluster of microcysts left breast.

RECOMMENDATION:
Bilateral diagnostic mammography and left breast ultrasound in 12
months to demonstrate 2 years of stability.

I have discussed the findings and recommendations with the patient.
Results were also provided in writing at the conclusion of the
visit. If applicable, a reminder letter will be sent to the patient
regarding the next appointment.

BI-RADS CATEGORY  3: Probably benign.

## 2015-12-28 ENCOUNTER — Encounter: Payer: Self-pay | Admitting: Family Medicine

## 2015-12-28 ENCOUNTER — Ambulatory Visit (INDEPENDENT_AMBULATORY_CARE_PROVIDER_SITE_OTHER): Payer: 59 | Admitting: Family Medicine

## 2015-12-28 VITALS — BP 118/66 | HR 77 | Temp 98.1°F | Wt 195.2 lb

## 2015-12-28 DIAGNOSIS — R51 Headache: Secondary | ICD-10-CM

## 2015-12-28 DIAGNOSIS — R519 Headache, unspecified: Secondary | ICD-10-CM

## 2015-12-28 MED ORDER — VARENICLINE TARTRATE 1 MG PO TABS: 1.0000 mg | ORAL_TABLET | Freq: Two times a day (BID) | ORAL | Status: DC

## 2015-12-28 NOTE — Progress Notes (Signed)
Subjective:   Patient ID: Jane Valdez, female    DOB: 01-27-1966, 50 y.o.   MRN: [id number]  Jane Valdez is a pleasant 50 y.o. year old female who presents to clinic today with Headache  on 12/28/2015  HPI:  Headache- right sided- behind her right ear, sometimes radiates to top of head.  Ongoing, daily for over a month.  4/10.  Intermittent but "usually there." Ibuprofen has not helped much.   Denies any nausea, vomiting or sensitivity to light.  No changes in her hearing or vision.  Does not wake her from sleep.  No other neurological symptoms that she has noted.  No neck pain.  Current Outpatient Prescriptions on File Prior to Visit  Medication Sig Dispense Refill  . fluocinonide (LIDEX) 0.05 % external solution Apply 1 application topically 2 (two) times daily.    . Melatonin 10 MG TABS Take by mouth.    . norethindrone (MICRONOR,CAMILA,ERRIN) 0.35 MG tablet Take 1 tablet by mouth daily.    . traZODone (DESYREL) 50 MG tablet TAKE 1 TO 2 TABLETS BY MOUTH DAILY AT BEDTIME FOR INSOMNIA 30 tablet 3   No current facility-administered medications on file prior to visit.    No Known Allergies  Past Medical History  Diagnosis Date  . Depression   . Frequent headaches   . GERD (gastroesophageal reflux disease)   . Lupus ([place])     skin lupus    Past Surgical History  Procedure Laterality Date  . Cholecystectomy      Family History  Problem Relation Age of Onset  . Hypertension Mother   . Hyperlipidemia Father   . Heart disease Father   . Diabetes Paternal Grandmother     Social History   Social History  . Marital Status: Married    Spouse Name: N/A  . Number of Children: N/A  . Years of Education: N/A   Occupational History  . Not on file.   Social History Main Topics  . Smoking status: Current Some Day Smoker  . Smokeless tobacco: Never Used  . Alcohol Use: Yes  . Drug Use: No  . Sexual Activity: Yes   Other Topics Concern  . Not on file    Social History Narrative   The PMH, PSH, Social History, Family History, Medications, and allergies have been reviewed in [facility], and have been updated if relevant.   Review of Systems  Constitutional: Negative.   Eyes: Negative.   Respiratory: Negative.   Musculoskeletal: Negative.   Skin: Negative.   Neurological: Positive for headaches. Negative for dizziness, tremors, seizures, syncope, facial asymmetry, speech difficulty, weakness, light-headedness and numbness.  All other systems reviewed and are negative.      Objective:    BP 118/66 mmHg  Pulse 77  Temp(Src) 98.1 F (36.7 C) (Oral)  Wt 195 lb 4 oz (88.565 kg)  SpO2 98%   Physical Exam  Constitutional: She is oriented to person, place, and time. She appears well-developed and well-nourished. No distress.  HENT:  Head: Normocephalic.  Right Ear: Hearing, tympanic membrane, external ear and ear canal normal.  Left Ear: Hearing, tympanic membrane, external ear and ear canal normal.  Eyes: Conjunctivae are normal.  Cardiovascular: Normal rate.   Pulmonary/Chest: Effort normal.  Musculoskeletal: Normal range of motion.  Neurological: She is alert and oriented to person, place, and time. She has normal strength. She displays no tremor. No cranial nerve deficit or sensory deficit. Coordination and gait normal.  Skin: Skin is warm  and dry. She is not diaphoretic.  Psychiatric: She has a normal mood and affect. Her behavior is normal. Judgment and thought content normal.  Nursing note and vitals reviewed.         Assessment & Plan:   Headache, unspecified headache type - Plan: CT Head W Wo Contrast, Comprehensive metabolic panel No Follow-up on file.

## 2015-12-28 NOTE — Progress Notes (Signed)
Pre visit review using our clinic review tool, if applicable. No additional management support is needed unless otherwise documented below in the visit note. 

## 2015-12-28 NOTE — Addendum Note (Signed)
Addended by: Ellamae Sia on: 12/28/2015 03:51 PM   Modules accepted: Orders

## 2015-12-28 NOTE — Assessment & Plan Note (Signed)
New- ?occipital neuralgia. Due to persistent nature of symptoms along with duration, will get imaging of head prior to considering tx options. The patient indicates understanding of these issues and agrees with the plan. Orders Placed This Encounter  Procedures  . CT Head W Wo Contrast  . Comprehensive metabolic panel

## 2015-12-28 NOTE — Patient Instructions (Signed)
Great to see you. Please stop by to see Jane Valdez on your way out.   

## 2015-12-29 ENCOUNTER — Other Ambulatory Visit (INDEPENDENT_AMBULATORY_CARE_PROVIDER_SITE_OTHER): Payer: 59

## 2015-12-29 DIAGNOSIS — R51 Headache: Secondary | ICD-10-CM

## 2015-12-29 DIAGNOSIS — R519 Headache, unspecified: Secondary | ICD-10-CM

## 2015-12-29 LAB — COMPREHENSIVE METABOLIC PANEL
ALT: 13 U/L (ref 0–35)
AST: 14 U/L (ref 0–37)
Albumin: 4 g/dL (ref 3.5–5.2)
Alkaline Phosphatase: 66 U/L (ref 39–117)
BILIRUBIN TOTAL: 0.6 mg/dL (ref 0.2–1.2)
BUN: 11 mg/dL (ref 6–23)
CALCIUM: 9.3 mg/dL (ref 8.4–10.5)
CO2: 24 meq/L (ref 19–32)
CREATININE: 0.64 mg/dL (ref 0.40–1.20)
Chloride: 104 mEq/L (ref 96–112)
GFR: 104.61 mL/min (ref >60.00)
GLUCOSE: 91 mg/dL (ref 70–99)
Potassium: 4.6 mEq/L (ref 3.5–5.1)
SODIUM: 138 meq/L (ref 135–145)
Total Protein: 6.7 g/dL (ref 6.0–8.3)

## 2015-12-30 ENCOUNTER — Encounter: Payer: Self-pay | Admitting: *Deleted

## 2015-12-31 ENCOUNTER — Ambulatory Visit (INDEPENDENT_AMBULATORY_CARE_PROVIDER_SITE_OTHER)
Admission: RE | Admit: 2015-12-31 | Discharge: 2015-12-31 | Disposition: A | Discharge status: Home / Self Care | Payer: 59 | Source: Ambulatory Visit | Attending: Family Medicine | Admitting: Family Medicine

## 2015-12-31 DIAGNOSIS — R51 Headache: Secondary | ICD-10-CM | POA: Diagnosis not present

## 2015-12-31 DIAGNOSIS — R519 Headache, unspecified: Secondary | ICD-10-CM

## 2015-12-31 MED ORDER — IOHEXOL 300 MG/ML  SOLN
80.0000 mL | Freq: Once | INTRAMUSCULAR | Status: AC | PRN
  Administered 2015-12-31: 80 mL via INTRAVENOUS

## 2016-01-21 ENCOUNTER — Other Ambulatory Visit: Payer: Self-pay | Admitting: Family Medicine

## 2016-05-08 ENCOUNTER — Other Ambulatory Visit: Payer: Self-pay | Admitting: Family Medicine

## 2016-05-09 NOTE — Telephone Encounter (Signed)
Ok to refill one time only.  Needs follow up for further refills.

## 2016-05-09 NOTE — Telephone Encounter (Signed)
Last insomnia f/u 02/2015

## 2016-05-23 LAB — RESULTS CONSOLE HPV: CHL HPV: NEGATIVE

## 2016-05-23 LAB — HM PAP SMEAR: HM Pap smear: NEGATIVE

## 2016-06-04 ENCOUNTER — Other Ambulatory Visit: Payer: Self-pay | Admitting: Family Medicine

## 2016-08-03 ENCOUNTER — Other Ambulatory Visit: Payer: Self-pay | Admitting: Family Medicine

## 2016-10-24 HISTORY — PX: COLONOSCOPY: SHX174

## 2017-02-01 ENCOUNTER — Other Ambulatory Visit (INDEPENDENT_AMBULATORY_CARE_PROVIDER_SITE_OTHER): Payer: 59

## 2017-02-01 ENCOUNTER — Other Ambulatory Visit: Payer: Self-pay | Admitting: Family Medicine

## 2017-02-01 DIAGNOSIS — Z01419 Encounter for gynecological examination (general) (routine) without abnormal findings: Secondary | ICD-10-CM

## 2017-02-02 LAB — CBC WITH DIFFERENTIAL/PLATELET
Basophils Absolute: 0.1 10*3/uL (ref 0.0–0.1)
Basophils Relative: 1.6 % (ref 0.0–3.0)
EOS PCT: 0.9 % (ref 0.0–5.0)
Eosinophils Absolute: 0.1 10*3/uL (ref 0.0–0.7)
HCT: 44 % (ref 36.0–46.0)
HEMOGLOBIN: 14.7 g/dL (ref 12.0–15.0)
Lymphocytes Relative: 23.6 % (ref 12.0–46.0)
Lymphs Abs: 2.2 10*3/uL (ref 0.7–4.0)
MCHC: 33.3 g/dL (ref 30.0–36.0)
MCV: 96.4 fl (ref 78.0–100.0)
Monocytes Absolute: 0.9 10*3/uL (ref 0.1–1.0)
Monocytes Relative: 9.6 % (ref 3.0–12.0)
NEUTROS PCT: 64.3 % (ref 43.0–77.0)
Neutro Abs: 5.9 10*3/uL (ref 1.4–7.7)
Platelets: 245 10*3/uL (ref 150.0–400.0)
RBC: 4.56 Mil/uL (ref 3.87–5.11)
RDW: 13.1 % (ref 11.5–15.5)
WBC: 9.2 10*3/uL (ref 4.0–10.5)

## 2017-02-02 LAB — COMPREHENSIVE METABOLIC PANEL
ALBUMIN: 4 g/dL (ref 3.5–5.2)
ALK PHOS: 58 U/L (ref 39–117)
ALT: 15 U/L (ref 0–35)
AST: 18 U/L (ref 0–37)
BUN: 13 mg/dL (ref 6–23)
CO2: 28 mEq/L (ref 19–32)
Calcium: 9 mg/dL (ref 8.4–10.5)
Chloride: 106 mEq/L (ref 96–112)
Creatinine, Ser: 0.76 mg/dL (ref 0.40–1.20)
GFR: 85.41 mL/min (ref >60.00)
Glucose, Bld: 98 mg/dL (ref 70–99)
POTASSIUM: 4 meq/L (ref 3.5–5.1)
Sodium: 139 mEq/L (ref 135–145)
TOTAL PROTEIN: 6.8 g/dL (ref 6.0–8.3)
Total Bilirubin: 0.3 mg/dL (ref 0.2–1.2)

## 2017-02-02 LAB — TSH: TSH: 1.41 u[IU]/mL (ref 0.35–4.50)

## 2017-02-02 LAB — LIPID PANEL
CHOLESTEROL: 168 mg/dL (ref 0–200)
HDL: 53.8 mg/dL (ref >39.00)
LDL Cholesterol: 101 mg/dL — ABNORMAL HIGH (ref 0–99)
NonHDL: 114.58
Total CHOL/HDL Ratio: 3
Triglycerides: 67 mg/dL (ref 0.0–149.0)
VLDL: 13.4 mg/dL (ref 0.0–40.0)

## 2017-02-07 ENCOUNTER — Ambulatory Visit (INDEPENDENT_AMBULATORY_CARE_PROVIDER_SITE_OTHER): Payer: 59 | Admitting: Family Medicine

## 2017-02-07 ENCOUNTER — Encounter: Payer: Self-pay | Admitting: Family Medicine

## 2017-02-07 VITALS — BP 110/74 | HR 94 | Temp 98.2°F | Ht 64.0 in | Wt 200.5 lb

## 2017-02-07 DIAGNOSIS — Z1211 Encounter for screening for malignant neoplasm of colon: Secondary | ICD-10-CM | POA: Diagnosis not present

## 2017-02-07 DIAGNOSIS — G4700 Insomnia, unspecified: Secondary | ICD-10-CM

## 2017-02-07 DIAGNOSIS — Z01419 Encounter for gynecological examination (general) (routine) without abnormal findings: Secondary | ICD-10-CM | POA: Diagnosis not present

## 2017-02-07 MED ORDER — TRAZODONE HCL 100 MG PO TABS: 150.0000 mg | ORAL_TABLET | Freq: Every evening | ORAL | 3 refills | Status: DC | PRN

## 2017-02-07 NOTE — Progress Notes (Signed)
Subjective:   Patient ID: Jane Valdez, female    DOB: 08-Jan-1966, 51 y.o.   MRN: [id number]  Jane Valdez is a pleasant 51 y.o. year old female who presents to clinic today with Annual Exam  on 02/07/2017  HPI:   G2P2 Has OBGYN- Dr. Harrington [facility])- has appt for later this summer. She is still on OCPs. Has never had a colonoscopy. No family history of colon CA that she is aware.  Mammogram 12/08/2014  Insomnia- for years, difficulty staying asleep.  No trouble falling asleep. Takes Melatonin, tylenol pm, benadryl prn insomnia.  They have minimal benefit. Tries lunesta in past, not helpful.   Started Trazodone she established care with me years ago, felt it did not work so she did not ask for a refill but it was at a low dose.     Obesity- referred her to a nutrionist [place] few years ago. Lost 20 pounds but has regained most of her weight back.  Needs to get motivated to lose weight again. Has been unde more stress at home.  Wt Readings from Last 3 Encounters:  02/07/17 200 lb 8 oz (90.9 kg)  12/28/15 195 lb 4 oz (88.6 kg)  03/03/15 181 lb 4 oz (82.2 kg)    Lab Results  Component Value Date   CHOL 168 02/01/2017   HDL 53.80 02/01/2017   LDLCALC 101 (H) 02/01/2017   TRIG 67.0 02/01/2017   CHOLHDL 3 02/01/2017   Lab Results  Component Value Date   NA 139 02/01/2017   K 4.0 02/01/2017   CL 106 02/01/2017   CO2 28 02/01/2017   Lab Results  Component Value Date   ALT 15 02/01/2017   AST 18 02/01/2017   ALKPHOS 58 02/01/2017   BILITOT 0.3 02/01/2017   Lab Results  Component Value Date   TSH 1.41 02/01/2017          Current Outpatient Prescriptions on File Prior to Visit  Medication Sig Dispense Refill  . Melatonin 10 MG TABS Take by mouth.    . norethindrone (MICRONOR,CAMILA,ERRIN) 0.35 MG tablet Take 1 tablet by mouth daily.    . traZODone (DESYREL) 50 MG tablet Take 1 to 2 tablets by mouth daily at bedtime for insomnia. OFFICE VISIT  REQUIRED FOR ADDITIONAL REFILLS 30 tablet 0   No current facility-administered medications on file prior to visit.     No Known Allergies  Past Medical History:  Diagnosis Date  . Depression   . Frequent headaches   . GERD (gastroesophageal reflux disease)   . Lupus    skin lupus    Past Surgical History:  Procedure Laterality Date  . CHOLECYSTECTOMY      Family History  Problem Relation Age of Onset  . Hypertension Mother   . Hyperlipidemia Father   . Heart disease Father   . Diabetes Paternal Grandmother     Social History   Social History  . Marital status: Married    Spouse name: N/A  . Number of children: N/A  . Years of education: N/A   Occupational History  . Not on file.   Social History Main Topics  . Smoking status: Former Research scientist (life sciences)  . Smokeless tobacco: Never Used  . Alcohol use Yes  . Drug use: No  . Sexual activity: Yes   Other Topics Concern  . Not on file   Social History Narrative  . No narrative on file   The PMH, [place], Social History, Family History,  Medications, and allergies have been reviewed in [facility], and have been updated if relevant.   Review of Systems  HENT: Negative.   Eyes: Negative.   Respiratory: Negative.   Cardiovascular: Negative.   Gastrointestinal: Negative.   Endocrine: Negative.   Genitourinary: Negative.   Musculoskeletal: Negative.   Allergic/Immunologic: Negative.   Neurological: Negative.   Hematological: Negative.   Psychiatric/Behavioral: Positive for sleep disturbance. Negative for decreased concentration, dysphoric mood and self-injury. The patient is nervous/anxious.   All other systems reviewed and are negative.      Objective:    BP 110/74   Pulse 94   Temp 98.2 F (36.8 C) (Oral)   Ht 5\' 4"  (1.626 m)   Wt 200 lb 8 oz (90.9 kg)   BMI 34.42 kg/m    Physical Exam   General:  Well-developed,well-nourished,in no acute distress; alert,appropriate and cooperative throughout examination Head:   normocephalic and atraumatic.   Eyes:  vision grossly intact, PERRL Ears:  R ear normal and L ear normal externally, TMs clear bilaterally Nose:  no external deformity.   Mouth:  good dentition.   Neck:  No deformities, masses, or tenderness noted. Breasts:  No mass, nodules, thickening, tenderness, bulging, retraction, inflamation, nipple discharge or skin changes noted.   Lungs:  Normal respiratory effort, chest expands symmetrically. Lungs are clear to auscultation, no crackles or wheezes. Heart:  Normal rate and regular rhythm. S1 and S2 normal without gallop, murmur, click, rub or other extra sounds. Abdomen:  Bowel sounds positive,abdomen soft and non-tender without masses, organomegaly or hernias noted. Msk:  No deformity or scoliosis noted of thoracic or lumbar spine.   Extremities:  No clubbing, cyanosis, edema, or deformity noted with normal full range of motion of all joints.   Neurologic:  alert & oriented X3 and gait normal.   Skin:  Intact without suspicious lesions or rashes Cervical Nodes:  No lymphadenopathy noted Axillary Nodes:  No palpable lymphadenopathy Psych:  Cognition and judgment appear intact. Alert and cooperative with normal attention span and concentration. No apparent delusions, illusions, hallucinations       Assessment & Plan:   Insomnia, unspecified type  Well woman exam No Follow-up on file.

## 2017-02-07 NOTE — Assessment & Plan Note (Signed)
Deteriorated. She is willing to try trazodone again at a higher dose. eRx sent for Trazodone 150 mg nightly. Call or return to clinic prn if these symptoms worsen or fail to improve as anticipated.

## 2017-02-07 NOTE — Progress Notes (Signed)
Pre visit review using our clinic review tool, if applicable. No additional management support is needed unless otherwise documented below in the visit note. 

## 2017-02-07 NOTE — Assessment & Plan Note (Addendum)
Reviewed preventive care protocols, scheduled due services, and updated immunizations Discussed nutrition, exercise, diet, and healthy lifestyle.  Refer to GI for colonoscopy. 

## 2017-02-07 NOTE — Assessment & Plan Note (Signed)
Deteriorated. Discussed making a plan for physical activity and recording calories. The patient indicates understanding of these issues and agrees with the plan.

## 2017-02-07 NOTE — Patient Instructions (Signed)
Great to see you. Please schedule your mammogram. We will call you with your GI appointment.  We have increased your Trazodone to 150 mg nightly.  Please keep me updated.

## 2017-04-07 ENCOUNTER — Other Ambulatory Visit: Payer: Self-pay | Admitting: Obstetrics and Gynecology

## 2017-04-07 DIAGNOSIS — N6320 Unspecified lump in the left breast, unspecified quadrant: Secondary | ICD-10-CM

## 2017-04-12 ENCOUNTER — Ambulatory Visit
Admission: RE | Admit: 2017-04-12 | Discharge: 2017-04-12 | Disposition: A | Discharge status: Home / Self Care | Payer: 59 | Source: Ambulatory Visit | Attending: Obstetrics and Gynecology | Admitting: Obstetrics and Gynecology

## 2017-04-12 DIAGNOSIS — N6320 Unspecified lump in the left breast, unspecified quadrant: Secondary | ICD-10-CM

## 2017-04-12 DIAGNOSIS — N6489 Other specified disorders of breast: Secondary | ICD-10-CM | POA: Diagnosis not present

## 2017-04-12 DIAGNOSIS — R928 Other abnormal and inconclusive findings on diagnostic imaging of breast: Secondary | ICD-10-CM | POA: Diagnosis not present

## 2017-05-24 DIAGNOSIS — Z01419 Encounter for gynecological examination (general) (routine) without abnormal findings: Secondary | ICD-10-CM | POA: Diagnosis not present

## 2017-05-24 DIAGNOSIS — Z124 Encounter for screening for malignant neoplasm of cervix: Secondary | ICD-10-CM | POA: Diagnosis not present

## 2017-05-26 LAB — HM PAP SMEAR: HM Pap smear: NEGATIVE

## 2017-05-26 LAB — RESULTS CONSOLE HPV: CHL HPV: NEGATIVE

## 2017-06-02 ENCOUNTER — Other Ambulatory Visit: Payer: Self-pay | Admitting: Family Medicine

## 2017-06-27 ENCOUNTER — Encounter: Payer: Self-pay | Admitting: Family Medicine

## 2017-07-28 ENCOUNTER — Other Ambulatory Visit: Payer: Self-pay | Admitting: Family Medicine

## 2017-07-28 DIAGNOSIS — Z1211 Encounter for screening for malignant neoplasm of colon: Secondary | ICD-10-CM

## 2017-07-31 ENCOUNTER — Telehealth: Payer: Self-pay | Admitting: Family Medicine

## 2017-07-31 DIAGNOSIS — Z1211 Encounter for screening for malignant neoplasm of colon: Secondary | ICD-10-CM

## 2017-07-31 NOTE — Telephone Encounter (Signed)
Referral placed.

## 2017-07-31 NOTE — Telephone Encounter (Signed)
PT sent MyChart request for colonoscopy. There was a referral in the system but has expired. She is not having new symptoms but is requesting a current referral.

## 2017-08-08 ENCOUNTER — Encounter: Payer: Self-pay | Admitting: Internal Medicine

## 2017-09-20 ENCOUNTER — Other Ambulatory Visit: Payer: Self-pay

## 2017-09-20 ENCOUNTER — Ambulatory Visit (AMBULATORY_SURGERY_CENTER): Payer: Self-pay | Admitting: *Deleted

## 2017-09-20 VITALS — Ht 64.0 in | Wt 201.2 lb

## 2017-09-20 DIAGNOSIS — Z1211 Encounter for screening for malignant neoplasm of colon: Secondary | ICD-10-CM

## 2017-09-20 NOTE — Progress Notes (Signed)
No egg or soy allergy known to patient  No issues with past sedation with any surgeries  or procedures, no intubation problems  No diet pills per patient No home 02 use per patient  No blood thinners per patient  Pt denies issues with constipation  No A fib or A flutter  EMMI video sent to pt's e mail  Pt. declined 

## 2017-09-21 ENCOUNTER — Encounter: Payer: Self-pay | Admitting: Internal Medicine

## 2017-10-04 ENCOUNTER — Other Ambulatory Visit: Payer: Self-pay

## 2017-10-04 ENCOUNTER — Encounter: Payer: Self-pay | Admitting: Internal Medicine

## 2017-10-04 ENCOUNTER — Ambulatory Visit (AMBULATORY_SURGERY_CENTER): Payer: 59 | Admitting: Internal Medicine

## 2017-10-04 VITALS — BP 119/71 | HR 65 | Temp 97.8°F | Resp 13 | Ht 64.0 in | Wt 201.0 lb

## 2017-10-04 DIAGNOSIS — Z1211 Encounter for screening for malignant neoplasm of colon: Secondary | ICD-10-CM | POA: Diagnosis present

## 2017-10-04 DIAGNOSIS — D123 Benign neoplasm of transverse colon: Secondary | ICD-10-CM

## 2017-10-04 MED ORDER — SODIUM CHLORIDE 0.9 % IV SOLN: 500.0000 mL | Freq: Once | INTRAVENOUS | Status: DC

## 2017-10-04 NOTE — Patient Instructions (Addendum)
   I found and removed one polyp.  I will let you know pathology results and when to have another routine colonoscopy by mail and/or My Chart.  I appreciate the opportunity to care for you. Gatha Mayer, MD, FACG YOU HAD AN ENDOSCOPIC PROCEDURE TODAY AT [facility]:   Refer to the procedure report that was given to you for any specific questions about what was found during the examination.  If the procedure report does not answer your questions, please call your gastroenterologist to clarify.  If you requested that your care partner not be given the details of your procedure findings, then the procedure report has been included in a sealed envelope for you to review at your convenience later.  YOU SHOULD EXPECT: Some feelings of bloating in the abdomen. Passage of more gas than usual.  Walking can help get rid of the air that was put into your GI tract during the procedure and reduce the bloating. If you had a lower endoscopy (such as a colonoscopy or flexible sigmoidoscopy) you may notice spotting of blood in your stool or on the toilet paper. If you underwent a bowel prep for your procedure, you may not have a normal bowel movement for a few days.  Please Note:  You might notice some irritation and congestion in your nose or some drainage.  This is from the oxygen used during your procedure.  There is no need for concern and it should clear up in a day or so.  SYMPTOMS TO REPORT IMMEDIATELY:   Following lower endoscopy (colonoscopy or flexible sigmoidoscopy):  Excessive amounts of blood in the stool  Significant tenderness or worsening of abdominal pains  Swelling of the abdomen that is new, acute  Fever of 100F or higher  For urgent or emergent issues, a gastroenterologist can be reached at any hour by calling [phone].   DIET:  We do recommend a small meal at first, but then you may proceed to your regular diet.  Drink plenty of fluids but you should avoid  alcoholic beverages for 24 hours.  ACTIVITY:  You should plan to take it easy for the rest of today and you should NOT DRIVE or use heavy machinery until tomorrow (because of the sedation medicines used during the test).    FOLLOW UP: Our staff will call the number listed on your records the next business day following your procedure to check on you and address any questions or concerns that you may have regarding the information given to you following your procedure. If we do not reach you, we will leave a message.  However, if you are feeling well and you are not experiencing any problems, there is no need to return our call.  We will assume that you have returned to your regular daily activities without incident.  If any biopsies were taken you will be contacted by phone or by letter within the next 1-3 weeks.  Please call us at [phone] if you have not heard about the biopsies in 3 weeks.   Await for biopsy results to determine next repeat Colonoscopy screening Polyp (handout given)  SIGNATURES/CONFIDENTIALITY: You and/or your care partner have signed paperwork which will be entered into your electronic medical record.  These signatures attest to the fact that that the information above on your After Visit Summary has been reviewed and is understood.  Full responsibility of the confidentiality of this discharge information lies with you and/or your care-partner.

## 2017-10-04 NOTE — Op Note (Signed)
[place] Patient Name: Jane Valdez Procedure Date: 10/04/2017 9:01 AM MRN: [id number] Endoscopist: Gatha Mayer , MD Age: 51 Referring MD:  Date of Birth: January 06, 1966 Gender: Female Account #: [id number] Procedure:                Colonoscopy Indications:              Screening for colorectal malignant neoplasm, This                            is the patient's first colonoscopy Medicines:                Propofol per Anesthesia, Monitored Anesthesia Care Procedure:                Pre-Anesthesia Assessment:                           - Prior to the procedure, a History and Physical                            was performed, and patient medications and                            allergies were reviewed. The patient's tolerance of                            previous anesthesia was also reviewed. The risks                            and benefits of the procedure and the sedation                            options and risks were discussed with the patient.                            All questions were answered, and informed consent                            was obtained. Prior Anticoagulants: The patient has                            taken no previous anticoagulant or antiplatelet                            agents. ASA Grade Assessment: II - A patient with                            mild systemic disease. After reviewing the risks                            and benefits, the patient was deemed in                            satisfactory condition to undergo the procedure.  After obtaining informed consent, the colonoscope                            was passed under direct vision. Throughout the                            procedure, the patient's blood pressure, pulse, and                            oxygen saturations were monitored continuously. The                            Model CF-HQ190L [id number]) scope was introduced   through the anus and advanced to the the cecum,                            identified by appendiceal orifice and ileocecal                            valve. The colonoscopy was performed without                            difficulty. The patient tolerated the procedure                            well. The quality of the bowel preparation was                            excellent. The ileocecal valve, appendiceal                            orifice, and rectum were photographed. The bowel                            preparation used was Miralax. Scope In: 9:17:26 AM Scope Out: 9:34:52 AM Scope Withdrawal Time: 0 hours 14 minutes 8 seconds  Total Procedure Duration: 0 hours 17 minutes 26 seconds  Findings:                 The perianal and digital rectal examinations were                            normal.                           A 10 mm polyp was found in the transverse colon.                            The polyp was sessile. The polyp was removed with a                            cold snare. Resection and retrieval were complete.                            Verification of patient identification  for the                            specimen was done. Estimated blood loss was minimal.                           The exam was otherwise without abnormality on                            direct and retroflexion views. Complications:            No immediate complications. Estimated Blood Loss:     Estimated blood loss was minimal. Impression:               - One 10 mm polyp in the transverse colon, removed                            with a cold snare. Resected and retrieved.                           - The examination was otherwise normal on direct                            and retroflexion views. Recommendation:           - Patient has a contact number available for                            emergencies. The signs and symptoms of potential                            delayed complications were  discussed with the                            patient. Return to normal activities tomorrow.                            Written discharge instructions were provided to the                            patient.                           - Resume previous diet.                           - Continue present medications.                           - Repeat colonoscopy is recommended. The                            colonoscopy date will be determined after pathology                            results from today's exam become available for  review. Gatha Mayer, MD 10/04/2017 9:41:21 AM This report has been signed electronically.

## 2017-10-04 NOTE — Progress Notes (Signed)
A and O x3. Report to RN. Tolerated MAC anesthesia well.

## 2017-10-04 NOTE — Progress Notes (Signed)
Called to room to assist during endoscopic procedure.  Patient ID and intended procedure confirmed with present staff. Received instructions for my participation in the procedure from the performing physician.  

## 2017-10-05 ENCOUNTER — Telehealth: Payer: Self-pay | Admitting: *Deleted

## 2017-10-05 NOTE — Telephone Encounter (Signed)
  Follow up Call-  Call back number 10/04/2017  Post procedure Call Back phone  # [phone]  Permission to leave phone message Yes  Some recent data might be hidden     Patient questions:  Do you have a fever, pain , or abdominal swelling? No. Pain Score  0 *  Have you tolerated food without any problems? Yes.    Have you been able to return to your normal activities? Yes.    Do you have any questions about your discharge instructions: Diet   No. Medications  No. Follow up visit  No.  Do you have questions or concerns about your Care? No.  Actions: * If pain score is 4 or above: No action needed, pain <4.

## 2017-10-10 ENCOUNTER — Encounter: Payer: Self-pay | Admitting: Internal Medicine

## 2017-10-10 DIAGNOSIS — Z860101 Personal history of adenomatous and serrated colon polyps: Secondary | ICD-10-CM | POA: Insufficient documentation

## 2017-10-10 DIAGNOSIS — Z86010 Personal history of colonic polyps: Secondary | ICD-10-CM

## 2017-10-10 HISTORY — DX: Personal history of adenomatous and serrated colon polyps: Z86.0101

## 2017-10-10 HISTORY — DX: Personal history of colonic polyps: Z86.010

## 2017-10-10 NOTE — Progress Notes (Signed)
10 mm adenoma Recall 2021 My Chart letter

## 2018-02-21 ENCOUNTER — Other Ambulatory Visit: Payer: Self-pay | Admitting: Family Medicine

## 2018-05-17 ENCOUNTER — Other Ambulatory Visit: Payer: Self-pay | Admitting: Family Medicine

## 2018-06-11 ENCOUNTER — Other Ambulatory Visit: Payer: Self-pay | Admitting: Family Medicine

## 2018-06-11 NOTE — Telephone Encounter (Signed)
Has not been seen in over a year/needs appt first/was advised with last refill that needs an OV but has not scheduled yet/thx dmf

## 2018-06-15 ENCOUNTER — Other Ambulatory Visit: Payer: Self-pay | Admitting: Family Medicine

## 2018-07-12 DIAGNOSIS — Z124 Encounter for screening for malignant neoplasm of cervix: Secondary | ICD-10-CM | POA: Diagnosis not present

## 2018-07-12 DIAGNOSIS — Z01419 Encounter for gynecological examination (general) (routine) without abnormal findings: Secondary | ICD-10-CM | POA: Diagnosis not present

## 2018-07-16 LAB — HM PAP SMEAR: HM Pap smear: NEGATIVE

## 2018-07-16 LAB — RESULTS CONSOLE HPV: CHL HPV: NEGATIVE

## 2018-07-20 ENCOUNTER — Telehealth: Payer: Self-pay | Admitting: Obstetrics and Gynecology

## 2018-07-20 NOTE — Telephone Encounter (Signed)
Called and left a message for patient to call back to schedule a new patient doctor referral appointment with our office to see Dr. Quincy Simmonds for: Mixed urinary incontinence.

## 2018-08-01 ENCOUNTER — Encounter: Payer: Self-pay | Admitting: Obstetrics and Gynecology

## 2018-08-01 ENCOUNTER — Ambulatory Visit: Payer: 59 | Admitting: Obstetrics and Gynecology

## 2018-08-01 ENCOUNTER — Other Ambulatory Visit: Payer: Self-pay

## 2018-08-01 VITALS — BP 130/64 | HR 70 | Resp 16 | Ht 64.0 in | Wt 198.0 lb

## 2018-08-01 DIAGNOSIS — N393 Stress incontinence (female) (male): Secondary | ICD-10-CM | POA: Diagnosis not present

## 2018-08-01 DIAGNOSIS — N760 Acute vaginitis: Secondary | ICD-10-CM

## 2018-08-01 DIAGNOSIS — N8110 Cystocele, unspecified: Secondary | ICD-10-CM | POA: Diagnosis not present

## 2018-08-01 NOTE — Progress Notes (Signed)
52 y.o. G2P2 Married Caucasian female here for urinary incontinence.    Urinary incontinence for 5 or more years, and it is worsening.  Wears a pad daily and feels like she has a yeast infection due to this.  Leaks with cough, sneeze, laughing, stepping off a curb, exercise.  Can feel a little burst of urine occasionally.  DF - every 1 - 1.5 hours. NF - 1 - 2 times.  No enuresis.  Feels like she is not voiding well and needs to do double voiding.   No UTIs recently.  Had one in the past.  No pyelonephritis.  No renal stones.   Drinks one thermos of coffee per day.  Drinks Diet Sprite sometimes.  Lemonade or tea at night.   No constipation.  No fecal incontinence.   Notices a bulge with standing in the shower.  Feels a ball.  Maybe some awareness of prolapse with sexual activity.   Has menstruation once in a while but not regularly.  No hot flashes but occasional night sweat.   Works in [employer] at [employer]. Does pack up boxes at work at times.  No regular heavy lifting.   Referred by Dr. Vanessa Kick.  She is a former patient of mine.   PCP: [place] @ [facility]    Patient's last menstrual period was 07/30/2018 (exact date).     Period Pattern: (!) Irregular(only has occasional spotting)     Sexually active: Yes.    The current method of family planning is oral progesterone-only contraceptive.    Exercising: No.  none Smoker:  Yes, smokes 3 cigs/day  Health Maintenance: Pap:  06/2018 normal per patient with [facility] History of abnormal Pap:  no MMG: 04-12-17 Diag.Bi w/Lt.U/W--stable benign probably cluster of microcysts in left/screening 1 [id number] Colonoscopy:  10-04-17 polyps;next due 2021 BMD:   n/a  Result  n/a TDaP:  Years ago Gardasil:   no HIV:2015 Neg Hep C:no    reports that she quit smoking about 22 months ago. Her smoking use included e-cigarettes and cigarettes. She has never used smokeless tobacco. She reports that she drinks  about 5.0 standard drinks of alcohol per week. She reports that she does not use drugs.  Past Medical History:  Diagnosis Date  . Allergy   . Anxiety   . Depression    not currently  . Frequent headaches   . GERD (gastroesophageal reflux disease)   . Hx of adenomatous polyp of colon 10/10/2017  . Lupus ([place])    skin lupus  . Urinary incontinence     Past Surgical History:  Procedure Laterality Date  . ANKLE SURGERY Left   . CHOLECYSTECTOMY    . FOOT SURGERY Left   . MOUTH SURGERY      Current Outpatient Medications  Medication Sig Dispense Refill  . Melatonin 10 MG TABS Take by mouth.    . norethindrone (MICRONOR,CAMILA,ERRIN) 0.35 MG tablet Take 1 tablet by mouth daily.    . traZODone (DESYREL) 100 MG tablet TAKE 1 AND 1/2 TABLETS BY MOUTH AT BEDTIME AS NEEDED FOR SLEEP. NEEDS APPT FOR REFILLS 135 tablet 0   No current facility-administered medications for this visit.     Family History  Problem Relation Age of Onset  . Hypertension Mother   . Hyperlipidemia Father   . Heart disease Father   . Hypertension Father   . Diabetes Paternal Grandmother   . Breast cancer Maternal Aunt        age 74's  or later  . Colon cancer Maternal Aunt   . Breast cancer Maternal Aunt        age 31's or later  . Breast cancer Maternal Aunt        age 46's or late  . Thyroid disease Sister   . Stroke Maternal Grandmother   . Colon polyps Neg Hx   . Rectal cancer Neg Hx   . Stomach cancer Neg Hx     Review of Systems  Constitutional: Negative.   HENT: Negative.   Eyes: Negative.   Respiratory: Negative.   Cardiovascular: Negative.   Gastrointestinal: Negative.   Endocrine: Negative.   Genitourinary:       Urinary incontinence  Musculoskeletal: Negative.   Skin: Negative.   Allergic/Immunologic: Negative.   Neurological: Negative.   Hematological: Negative.   Psychiatric/Behavioral: Negative.     Exam:   BP 130/64 (BP Location: Right Arm, Patient Position: Sitting,  Cuff Size: Large)   Pulse 70   Resp 16   Ht 5\' 4"  (1.626 m)   Wt 198 lb (89.8 kg)   LMP 07/30/2018 (Exact Date) Comment: spotting only  BMI 33.99 kg/m     General appearance: alert, cooperative and appears stated age Head: Normocephalic, without obvious abnormality, atraumatic Lungs: clear to auscultation bilaterally Heart: regular rate and rhythm Abdomen: soft, non-tender; no masses, no organomegaly Extremities: extremities normal, atraumatic, no cyanosis or edema Skin: Skin color, texture, turgor normal. No rashes or lesions Lymph nodes: Cervical, supraclavicular, and axillary nodes normal. No abnormal inguinal nodes palpated Neurologic: Grossly normal  Pelvic: External genitalia:  no lesions              Urethra:  normal appearing urethra with no masses, tenderness or lesions              Bartholins and Skenes: normal                 Vagina: white clumpy discharge.  First degree cystocele.  Good apical and posterior vaginal support.               Cervix: no lesions      Bimanual Exam:  Uterus:  normal size, contour, position, consistency, mobility, non-tender              Adnexa: no mass, fullness, tenderness              Rectal exam: Yes.  .  Confirms.              Anus:  normal sphincter tone, no lesions  Chaperone was present for exam.  Assessment:    Stress incontinence.  May have a component of overactive bladder. Cystocele.  Mild. Vaginitis.  Looks like yeast.  Plan:  I have had a comprehensive discussion with the patient regarding prolapse and urinary incontinence.  I have provided reading materials from [employer] regarding prolapse and incontinence in general as well as medical and surgical treatment for these conditions.   We talked about observational management also.  Medical treatments may include physical therapy, pessary use, Impressa and medication for overactive bladder.  We discussed surgical care options for anterior colporrhaphy and expected TVT Exact  midurethral sling and cystoscopy.     I would proceed with urodynamic testing prior to doing surgery.   She wants to consider her options at this time and may return for further discussion.    Affirm testing for vaginitis.  Vaginitis tx to follow.  I recommend she update her mammogram.  FU prn.   After visit summary provided.   __30_____ minutes face to face time of which over 50% was spent in counseling.

## 2018-08-02 LAB — VAGINITIS/VAGINOSIS, DNA PROBE
CANDIDA SPECIES: NEGATIVE
GARDNERELLA VAGINALIS: NEGATIVE
Trichomonas vaginosis: NEGATIVE

## 2018-08-16 ENCOUNTER — Telehealth: Payer: Self-pay | Admitting: Obstetrics and Gynecology

## 2018-08-16 NOTE — Telephone Encounter (Signed)
Patient sent the following message through [facility]. Routing to triage to assist patient with request.   Appointment Request From: Jane Valdez    With Provider: Arloa Koh, MD [facility]]    Preferred Date Range: Any    Preferred Times: Any time    Reason for visit: Request an Appointment    Comments:  I want to schedule the surgery we discussed from my last appointment

## 2018-08-16 NOTE — Telephone Encounter (Signed)
Spoke with patient. Patient request to proceed with surgery for bladder prolapse discussed at last OV 08/01/18.   Advised patient I will update Dr. Quincy Simmonds and let her know. Our benefits department will review benefits and return call to discuss.  Gay Filler will then return call to discuss surgery planning and urodynamics scheduling.   Patient verbalizes understanding and is agreeable.   Routing to Dr. Quincy Simmonds.   Cc: Lerry Liner, Lamont Snowball, RN

## 2018-08-16 NOTE — Telephone Encounter (Signed)
Patient does need urodynamic testing.  Her proposed surgery will likely be a TVT Exact midurethral sling and cystoscopy, anterior colporrhaphy.   This will all need to be precerted.  Cc- Lamont Snowball, Lerry Liner

## 2018-08-21 NOTE — Telephone Encounter (Signed)
Call to patient. Reviewed urodynamic testing and planning for surgery.  Patient is not in a hurry to proceed, may wait till first on January 2020.  Will await call from business office regarding benefits.  Encounter closed.

## 2018-08-22 ENCOUNTER — Telehealth: Payer: Self-pay | Admitting: Obstetrics and Gynecology

## 2018-08-22 NOTE — Telephone Encounter (Signed)
Patient calling for [facility] regarding surgery scheduling.

## 2018-08-23 NOTE — Telephone Encounter (Signed)
Returned call to patient on 08/22/18 and spoke extensively with patient regarding benefits for 2019 and surgery date availability for 2019. Patient advised she will speak with her employer regarding 2020 benefits, as she may consider procedure in 2020.   Patient called this morning to inform us she would like to proceed with scheduling surgery in 2019 and would like to know if date 10/09/18 is still available.  Advised patient we will forward to Nurse Supervisor, Lamont Snowball, RN to review with Dr Quincy Simmonds.  Patient understood and is agreeable to a return call   Routing to Lamont Snowball, RN

## 2018-08-27 NOTE — Telephone Encounter (Signed)
Patient calling to speak with Sally. °

## 2018-08-27 NOTE — Telephone Encounter (Signed)
Return call to patient. Discussed surgery date on 10-09-18.  Reviewed urodynamics and scheduled for 09-05-18. Will review surgery instructions at urodynamic appointment.    Encounter closed.

## 2018-08-30 ENCOUNTER — Telehealth: Payer: Self-pay | Admitting: *Deleted

## 2018-08-30 NOTE — Telephone Encounter (Signed)
Call to patient. Surgery Information Form reviewed with patient and she verbalized understanding. Pre and post op appointments scheduled for patient and she is agreeable to date and time of all appointments. Aware will receive at copy of Surgery Information Form at pre op appointment.   Routing to provider and will close encounter.   CC Lamont Snowball, RN

## 2018-08-31 ENCOUNTER — Ambulatory Visit (INDEPENDENT_AMBULATORY_CARE_PROVIDER_SITE_OTHER): Payer: 59

## 2018-08-31 VITALS — BP 130/72 | Ht 64.0 in | Wt 198.4 lb

## 2018-08-31 DIAGNOSIS — Z01812 Encounter for preprocedural laboratory examination: Secondary | ICD-10-CM | POA: Diagnosis not present

## 2018-08-31 LAB — POCT URINALYSIS DIPSTICK
Bilirubin, UA: NEGATIVE
GLUCOSE UA: NEGATIVE
Nitrite, UA: NEGATIVE
PH UA: 5 (ref 5.0–8.0)
Protein, UA: POSITIVE — AB
UROBILINOGEN UA: NEGATIVE U/dL — AB

## 2018-08-31 NOTE — Progress Notes (Signed)
Patient is here for a pre procedure UA prior to having urodynamics- Patient denies symptoms. Urin sent for [facility] and culture.

## 2018-09-01 LAB — URINALYSIS, MICROSCOPIC ONLY: Casts: NONE SEEN /lpf

## 2018-09-02 LAB — URINE CULTURE

## 2018-09-03 ENCOUNTER — Other Ambulatory Visit: Payer: Self-pay | Admitting: *Deleted

## 2018-09-03 MED ORDER — AMPICILLIN 500 MG PO CAPS: 500.0000 mg | ORAL_CAPSULE | Freq: Four times a day (QID) | ORAL | 0 refills | Status: DC

## 2018-09-05 ENCOUNTER — Ambulatory Visit: Payer: 59

## 2018-09-10 ENCOUNTER — Ambulatory Visit: Payer: 59 | Admitting: Obstetrics and Gynecology

## 2018-09-11 ENCOUNTER — Other Ambulatory Visit: Payer: Self-pay | Admitting: Family Medicine

## 2018-09-11 NOTE — Telephone Encounter (Signed)
Patient has not been seen by Dr. Deborra Medina since 4.17.18/must have OV first/thx dmf

## 2018-09-16 ENCOUNTER — Telehealth: Payer: Self-pay | Admitting: Gynecology

## 2018-09-16 NOTE — Telephone Encounter (Signed)
On-call note: Recently treated for UTI with ampicillin.  Complaining of the last day or so of increased vaginal discharge with itching.  Discussed probable yeast infection.  Diflucan 150 mg x 1 dose called to pharmacy.

## 2018-09-18 ENCOUNTER — Encounter (HOSPITAL_COMMUNITY): Payer: Self-pay | Admitting: *Deleted

## 2018-09-18 ENCOUNTER — Ambulatory Visit: Payer: 59 | Admitting: Obstetrics and Gynecology

## 2018-09-18 ENCOUNTER — Other Ambulatory Visit: Payer: Self-pay

## 2018-09-19 ENCOUNTER — Other Ambulatory Visit: Payer: Self-pay

## 2018-09-19 ENCOUNTER — Ambulatory Visit (INDEPENDENT_AMBULATORY_CARE_PROVIDER_SITE_OTHER): Payer: 59 | Admitting: Obstetrics and Gynecology

## 2018-09-19 VITALS — BP 130/80 | HR 66 | Resp 14 | Ht 64.0 in | Wt 199.4 lb

## 2018-09-19 DIAGNOSIS — R8290 Unspecified abnormal findings in urine: Secondary | ICD-10-CM | POA: Diagnosis not present

## 2018-09-19 NOTE — Progress Notes (Signed)
Patient here for test of cure UC prior to Urodynamics scheduled for 09-26-18. Completed ampicillin as prescribed. Patient denies urinary symptoms today. States she called with yeast symptoms and was treated with diflucan over the weekend. No symptoms of yeast today. Clean catch urine sent for culture. RN advised once results were back and Dr. Quincy Simmonds reviewed would call and update. Patient agreeable.    Routing to provider and will close encounter.

## 2018-09-21 LAB — URINE CULTURE

## 2018-09-26 ENCOUNTER — Ambulatory Visit: Payer: 59

## 2018-09-27 ENCOUNTER — Telehealth: Payer: Self-pay | Admitting: Obstetrics and Gynecology

## 2018-09-27 NOTE — Telephone Encounter (Signed)
Routing to S. Yeakley, RN.  

## 2018-09-27 NOTE — Telephone Encounter (Signed)
Patient is wondering should she keep the pre op visit for tomorrow since the urodynamics appointment was cancelled.

## 2018-09-27 NOTE — Progress Notes (Deleted)
GYNECOLOGY  VISIT   HPI: 52 y.o.   Married  Caucasian  female   G2P2 with No LMP recorded. (Menstrual status: Oral contraceptives).   here for surgery consult.  GYNECOLOGIC HISTORY: No LMP recorded. (Menstrual status: Oral contraceptives). Contraception: POP Menopausal hormone therapy:  n/a Last mammogram: : 04-12-17 Diag.Bi w/Lt.U/W--stable benign probably cluster of microcysts in left/screening 1 [id number] Last pap smear:  06/2018 normal per patient with [facility]        OB History    Gravida  2   Para  2   Term      Preterm      AB      Living  2     SAB      TAB      Ectopic      Multiple      Live Births                 Patient Active Problem List   Diagnosis Date Noted  . Hx of adenomatous polyp of colon 10/10/2017  . Well woman exam 02/07/2017  . Headache 12/28/2015  . Depression 02/28/2014  . Lupus ([place]) 02/28/2014  . GERD (gastroesophageal reflux disease) 02/28/2014  . Insomnia 02/28/2014  . Obesity 02/28/2014    Past Medical History:  Diagnosis Date  . Allergy   . Anxiety    no meds  . Depression    no meds  . Frequent headaches   . GERD (gastroesophageal reflux disease)    diet controlled, no meds   . Hx of adenomatous polyp of colon 10/10/2017  . Lupus ([place])    skin lupus  . SVD (spontaneous vaginal delivery)    x 2  . Urinary incontinence     Past Surgical History:  Procedure Laterality Date  . ANKLE SURGERY Left   . CHOLECYSTECTOMY    . COLONOSCOPY    . EYE SURGERY Bilateral    Lasik  . FOOT SURGERY Left   . MOUTH SURGERY     deep clean at gum line  . WISDOM TOOTH EXTRACTION      Current Outpatient Medications  Medication Sig Dispense Refill  . Melatonin 10 MG TABS Take by mouth.    . norethindrone (MICRONOR,CAMILA,ERRIN) 0.35 MG tablet Take 1 tablet by mouth daily.    . traZODone (DESYREL) 100 MG tablet TAKE 1 AND 1/2 TABLETS BY MOUTH AT BEDTIME AS NEEDED FOR SLEEP. NEEDS APPT FOR REFILLS 135 tablet 0   No  current facility-administered medications for this visit.      ALLERGIES: Patient has no known allergies.  Family History  Problem Relation Age of Onset  . Hypertension Mother   . Hyperlipidemia Father   . Heart disease Father   . Hypertension Father   . Diabetes Paternal Grandmother   . Breast cancer Maternal Aunt        age 61's or later  . Colon cancer Maternal Aunt   . Breast cancer Maternal Aunt        age 79's or later  . Breast cancer Maternal Aunt        age 66's or late  . Thyroid disease Sister   . Stroke Maternal Grandmother   . Colon polyps Neg Hx   . Rectal cancer Neg Hx   . Stomach cancer Neg Hx     Social History   Socioeconomic History  . Marital status: Married    Spouse name: Not on file  . Number of children: Not on  file  . Years of education: Not on file  . Highest education level: Not on file  Occupational History  . Not on file  Social Needs  . Financial resource strain: Not on file  . Food insecurity:    Worry: Not on file    Inability: Not on file  . Transportation needs:    Medical: Not on file    Non-medical: Not on file  Tobacco Use  . Smoking status: Current Some Day Smoker    Packs/day: 0.25    Types: Cigarettes  . Smokeless tobacco: Never Used  . Tobacco comment: smokes some occasionally   Substance and Sexual Activity  . Alcohol use: Yes    Alcohol/week: 7.0 standard drinks    Types: 7 Glasses of wine per week  . Drug use: No  . Sexual activity: Yes    Birth control/protection: Pill  Lifestyle  . Physical activity:    Days per week: Not on file    Minutes per session: Not on file  . Stress: Not on file  Relationships  . Social connections:    Talks on phone: Not on file    Gets together: Not on file    Attends religious service: Not on file    Active member of club or organization: Not on file    Attends meetings of clubs or organizations: Not on file    Relationship status: Not on file  . Intimate partner violence:     Fear of current or ex partner: Not on file    Emotionally abused: Not on file    Physically abused: Not on file    Forced sexual activity: Not on file  Other Topics Concern  . Not on file  Social History Narrative  . Not on file    Review of Systems  PHYSICAL EXAMINATION:    There were no vitals taken for this visit.    General appearance: alert, cooperative and appears stated age Head: Normocephalic, without obvious abnormality, atraumatic Neck: no adenopathy, supple, symmetrical, trachea midline and thyroid normal to inspection and palpation Lungs: clear to auscultation bilaterally Breasts: normal appearance, no masses or tenderness, No nipple retraction or dimpling, No nipple discharge or bleeding, No axillary or supraclavicular adenopathy Heart: regular rate and rhythm Abdomen: soft, non-tender, no masses,  no organomegaly Extremities: extremities normal, atraumatic, no cyanosis or edema Skin: Skin color, texture, turgor normal. No rashes or lesions Lymph nodes: Cervical, supraclavicular, and axillary nodes normal. No abnormal inguinal nodes palpated Neurologic: Grossly normal  Pelvic: External genitalia:  no lesions              Urethra:  normal appearing urethra with no masses, tenderness or lesions              Bartholins and Skenes: normal                 Vagina: normal appearing vagina with normal color and discharge, no lesions              Cervix: no lesions                Bimanual Exam:  Uterus:  normal size, contour, position, consistency, mobility, non-tender              Adnexa: no mass, fullness, tenderness              Rectal exam: {yes no:314532}.  Confirms.              Anus:  normal sphincter tone, no lesions  Chaperone was present for exam.  ASSESSMENT     PLAN     An After Visit Summary was printed and given to the patient.  ______ minutes face to face time of which over 50% was spent in counseling.

## 2018-09-27 NOTE — Telephone Encounter (Signed)
Call to patient approximately 150 pm. Urodynamics rescheduled to 10-01-18. Consult for results and surgery discussion rescheduled to 10-02-18. Urodynamics testing was cancelled yesterday due to office staffing issue.   Encounter closed.

## 2018-09-28 ENCOUNTER — Ambulatory Visit: Payer: 59 | Admitting: Obstetrics and Gynecology

## 2018-10-01 ENCOUNTER — Encounter: Payer: Self-pay | Admitting: Obstetrics and Gynecology

## 2018-10-01 ENCOUNTER — Ambulatory Visit: Payer: 59 | Admitting: Obstetrics and Gynecology

## 2018-10-01 VITALS — BP 118/68 | HR 84 | Resp 16 | Ht 64.0 in | Wt 199.0 lb

## 2018-10-01 DIAGNOSIS — N393 Stress incontinence (female) (male): Secondary | ICD-10-CM | POA: Diagnosis not present

## 2018-10-01 NOTE — Patient Instructions (Signed)
After your procedure:   You may have a mild bladder and rectal discomfort for a few hours after the test. . You may experience some frequent urination and slight burning the first few times you urinate after the test. Rarely, the urine may be blood tinged. These are both due to catheter placements and resolve quickly.  . You should call our office immediately if you have signs of infection, which may include bladder pain, urinary urgency, fever, or burning during urination. .  We do encourage you to drink plenty of water after completion of the test today.   Please call our office with any concerns or questions.   [facility] [address], [place], [place] [address] [facility]:  [phone]; Fax:  [phone]

## 2018-10-01 NOTE — Progress Notes (Signed)
Jane Valdez is a 52 y.o. female Who presents today for urodynamics testing, ordered by Dr. Quincy Simmonds   Allergies and medications reviewed.  Denies complaints today. No urinary complaints.   Urine Micro exam: negative for WBC's or RBC's, okay to proceed per Dr. Quincy Simmonds.  Patient reports urinary leakage with laughing, coughing, sneezing, step down off a curb.   Urodynamics testing initiated. Lumax Bladder Catheter #10 [place] and lumax Abdominal Catheter #10 [place].   Post void residual 80 ml.  #4 Milex Pessary used to reduce cystocele prior to placement of catheters.  Urethral catheter placed without issue. Rectal catheter placed without issue.  Urodynamics testing completed. Pessary and Catheters removed intact. Please see scanned Patient summary report in Epic. Procedure completed and patient tolerated well without complaints. Verbalized understanding of follow up instructions.  Patient scheduled for follow up office visit with Dr. Quincy Simmonds  to discuss results. Patient agreeable.

## 2018-10-02 ENCOUNTER — Encounter: Payer: Self-pay | Admitting: Obstetrics and Gynecology

## 2018-10-02 ENCOUNTER — Ambulatory Visit: Payer: 59 | Admitting: Obstetrics and Gynecology

## 2018-10-02 ENCOUNTER — Other Ambulatory Visit: Payer: Self-pay

## 2018-10-02 VITALS — BP 108/60 | HR 76 | Resp 16 | Ht 64.0 in | Wt 199.0 lb

## 2018-10-02 DIAGNOSIS — N393 Stress incontinence (female) (male): Secondary | ICD-10-CM

## 2018-10-02 DIAGNOSIS — N8110 Cystocele, unspecified: Secondary | ICD-10-CM

## 2018-10-02 NOTE — Progress Notes (Signed)
GYNECOLOGY  VISIT   HPI: 52 y.o.   Married  Caucasian  female   G2P2 with No LMP recorded (lmp unknown). (Menstrual status: Oral contraceptives).   here for results of urodynamics and surgery consult.  Desires surgery for urinary incontinence and cystocele.  Leaks urine with physical activities.  Normal bowel function.   Multichannel urodynamic testing with reduction of prolapse on 10/01/18.  Uroflow - void 138 cc, PVR 80 cc.  Continuous.  CMG - S1 110 cc, S2 133 cc, S3 205 cc, S4 313 cc. Cystometric capacity 351.9 cc.              VLPP 78 cm H2O at 352 cc. Unstable CMG.  UPP - 20 cm H2O at 352 cc.  Pressure flow - PDet max 47 cm H2O, voided 499 cc.   Evidence of mixed incontinence - stress incontinence more prominent.  Borderline low UPP.    GYNECOLOGIC HISTORY: No LMP recorded (lmp unknown). (Menstrual status: Oral contraceptives). Contraception:  Oral contraceptives Menopausal hormone therapy:  none Last mammogram:  04/12/17 Bilateral MMG/Left Breast US - BIRADS 2 benign/density b Last pap smear:   06/2018 normal per patient with [facility]        OB History    Gravida  2   Para  2   Term      Preterm      AB      Living  2     SAB      TAB      Ectopic      Multiple      Live Births                 Patient Active Problem List   Diagnosis Date Noted  . Hx of adenomatous polyp of colon 10/10/2017  . Well woman exam 02/07/2017  . Headache 12/28/2015  . Depression 02/28/2014  . Lupus ([place]) 02/28/2014  . GERD (gastroesophageal reflux disease) 02/28/2014  . Insomnia 02/28/2014  . Obesity 02/28/2014    Past Medical History:  Diagnosis Date  . Allergy   . Anxiety    no meds  . Depression    no meds  . Frequent headaches   . GERD (gastroesophageal reflux disease)    diet controlled, no meds   . Hx of adenomatous polyp of colon 10/10/2017  . Lupus ([place])    skin lupus  . SVD (spontaneous vaginal delivery)    x 2  . Urinary incontinence      Past Surgical History:  Procedure Laterality Date  . ANKLE SURGERY Left   . CHOLECYSTECTOMY    . COLONOSCOPY    . EYE SURGERY Bilateral    Lasik  . FOOT SURGERY Left   . MOUTH SURGERY     deep clean at gum line  . WISDOM TOOTH EXTRACTION      Current Outpatient Medications  Medication Sig Dispense Refill  . Melatonin 10 MG TABS Take by mouth.    . norethindrone (MICRONOR,CAMILA,ERRIN) 0.35 MG tablet Take 1 tablet by mouth daily.    . traZODone (DESYREL) 100 MG tablet TAKE 1 AND 1/2 TABLETS BY MOUTH AT BEDTIME AS NEEDED FOR SLEEP. NEEDS APPT FOR REFILLS 135 tablet 0   No current facility-administered medications for this visit.      ALLERGIES: Patient has no known allergies.  Family History  Problem Relation Age of Onset  . Hypertension Mother   . Hyperlipidemia Father   . Heart disease Father   . Hypertension  Father   . Diabetes Paternal Grandmother   . Breast cancer Maternal Aunt        age 24's or later  . Colon cancer Maternal Aunt   . Breast cancer Maternal Aunt        age 29's or later  . Breast cancer Maternal Aunt        age 82's or late  . Thyroid disease Sister   . Stroke Maternal Grandmother   . Colon polyps Neg Hx   . Rectal cancer Neg Hx   . Stomach cancer Neg Hx     Social History   Socioeconomic History  . Marital status: Married    Spouse name: Not on file  . Number of children: Not on file  . Years of education: Not on file  . Highest education level: Not on file  Occupational History  . Not on file  Social Needs  . Financial resource strain: Not on file  . Food insecurity:    Worry: Not on file    Inability: Not on file  . Transportation needs:    Medical: Not on file    Non-medical: Not on file  Tobacco Use  . Smoking status: Current Some Day Smoker    Packs/day: 0.25    Types: Cigarettes  . Smokeless tobacco: Never Used  . Tobacco comment: smokes some occasionally   Substance and Sexual Activity  . Alcohol use: Yes     Alcohol/week: 7.0 standard drinks    Types: 7 Glasses of wine per week  . Drug use: No  . Sexual activity: Yes    Birth control/protection: Pill  Lifestyle  . Physical activity:    Days per week: Not on file    Minutes per session: Not on file  . Stress: Not on file  Relationships  . Social connections:    Talks on phone: Not on file    Gets together: Not on file    Attends religious service: Not on file    Active member of club or organization: Not on file    Attends meetings of clubs or organizations: Not on file    Relationship status: Not on file  . Intimate partner violence:    Fear of current or ex partner: Not on file    Emotionally abused: Not on file    Physically abused: Not on file    Forced sexual activity: Not on file  Other Topics Concern  . Not on file  Social History Narrative  . Not on file    Review of Systems  Constitutional: Negative.   HENT: Negative.   Eyes: Negative.   Respiratory: Negative.   Cardiovascular: Negative.   Gastrointestinal: Negative.   Endocrine: Negative.   Genitourinary: Negative.   Musculoskeletal: Negative.   Skin: Negative.   Allergic/Immunologic: Negative.   Neurological: Negative.   Hematological: Negative.   Psychiatric/Behavioral: Negative.     PHYSICAL EXAMINATION:    BP 108/60 (BP Location: Right Arm, Patient Position: Sitting, Cuff Size: Large)   Pulse 76   Resp 16   Ht 5\' 4"  (1.626 m)   Wt 199 lb (90.3 kg)   LMP  (LMP Unknown)   BMI 34.16 kg/m     General appearance: alert, cooperative and appears stated age Head: Normocephalic, without obvious abnormality, atraumatic Neck: no adenopathy, supple, symmetrical, trachea midline and thyroid normal to inspection and palpation Lungs: clear to auscultation bilaterally Heart: regular rate and rhythm Abdomen: soft, non-tender, no masses,  no organomegaly Extremities: extremities  normal, atraumatic, no cyanosis or edema Skin: Skin color, texture, turgor normal. No  rashes or lesions Lymph nodes: Cervical, supraclavicular, and axillary nodes normal. No abnormal inguinal nodes palpated Neurologic: Grossly normal  Pelvic: External genitalia:  no lesions              Urethra:  normal appearing urethra with no masses, tenderness or lesions              Bartholins and Skenes: normal                 Vagina: normal appearing vagina with normal color and discharge, no lesions.  First degree cystocele.  Good apical and posterior vaginal support.               Cervix: no lesions                Bimanual Exam:  Uterus:  normal size, contour, position, consistency, mobility, non-tender              Adnexa: no mass, fullness, tenderness            Chaperone was present for exam.  ASSESSMENT   Urinary stress incontinence.  Cystocele.  Skin lupus.   PLAN  Discussion regarding findings of urodynamics confirming urinary stress incontinence.   We reviewed surgical treatment with midurethral sling, anterior colporrhaphy, and cystoscopy. We discussed permanent mesh materials which are currently the most widely used for the midurethral sling procedure.  I discussed slings being 85 - 90% effective in treatment of stress incontinence.  I discussed risks of surgery including but not limited to erosions and exposure of mesh, dyspareunia, cystotomy and damage to surrounding organs, urinary retention and slower voiding, increase in urgency symptoms, post op catheterization, infections, bleeding, reaction to anesthesia, DVT, PE, death, need for reoperation, and recurrence of prolapse and incontinence.  Surgical expectations and recovery discussed.  Patient wishes to proceed. Questions invited and answered.   An After Visit Summary was printed and given to the patient.  _25_____ minutes face to face time of which over 50% was spent in counseling.

## 2018-10-05 NOTE — Progress Notes (Addendum)
Multichannel urodynamic testing with reduction of prolapse  Uroflow - void 138 cc, PVR 80 cc.  Continuous.  CMG - S1 110 cc, S2 133 cc, S3 205 cc, S4 313 cc. Cystometric capacity 351.9 cc.              VLPP 78 cm H2O at 352 cc. Unstable CMG.  UPP - 20 cm H2O at 352 cc.  Pressure flow - PDet max 47 cm H2O, voided 499 cc.   Evidence of mixed incontinence - stress incontinence more prominent.   Borderline low UPP.

## 2018-10-08 NOTE — H&P (Signed)
Office Visit   10/02/2018 [place], Jane All, MD  Obstetrics and Gynecology   Urinary, incontinence, stress female +1 more  Dx   Office Visit   ; Referred by Lucille Passy, MD  Reason for Visit   Additional Documentation   Vitals:   BP 108/60 (BP Location: Right Arm, Patient Position: Sitting, Cuff Size: Large)   Pulse 76   Resp 16   Ht 5\' 4"  (1.626 m)   Wt 90.3 kg   LMP (LMP Unknown)   BMI 34.16 kg/m   BSA 2.02 m     More Vitals   Flowsheets:   MEWS Score,   Anthropometrics     Encounter Info:   Billing Info,   History,   Allergies,   Detailed Report     [facility] Notes   Progress Notes by Nunzio Cobbs, MD at 10/02/2018 11:00 AM  Author: Nunzio Cobbs, MD Author Type: Physician Filed: 10/06/2018 9:49 PM  Note Status: Signed Cosign: Cosign Not Required Encounter Date: 10/02/2018  Editor: Nunzio Cobbs, MD (Physician)  Prior Versions: 1. Archie Balboa CMA (Certified Psychologist, sport and exercise) at 10/02/2018 11:11 AM - Sign when Signing Visit    GYNECOLOGY  VISIT   HPI: 52 y.o.   Married  Caucasian  female   G2P2 with No LMP recorded (lmp unknown). (Menstrual status: Oral contraceptives).   here for results of urodynamics and surgery consult.  Desires surgery for urinary incontinence and cystocele.  Leaks urine with physical activities.  Normal bowel function.   Multichannel urodynamic testing with reduction of prolapse on 10/01/18.  Uroflow - void 138 cc, PVR 80 cc.  Continuous.  CMG - S1 110 cc, S2 133 cc, S3 205 cc, S4 313 cc. Cystometric capacity 351.9 cc.              VLPP 78 cm H2O at 352 cc. Unstable CMG.  UPP - 20 cm H2O at 352 cc.  Pressure flow - PDet max 47 cm H2O, voided 499 cc.   Evidence of mixed incontinence - stress incontinence more prominent.  Borderline low UPP.    GYNECOLOGIC HISTORY: No LMP recorded (lmp unknown). (Menstrual status: Oral  contraceptives). Contraception:  Oral contraceptives Menopausal hormone therapy:  none Last mammogram:  04/12/17 Bilateral MMG/Left Breast US - BIRADS 2 benign/density b Last pap smear:   06/2018 normal per patient with [facility]                OB History    Gravida  2   Para  2   Term      Preterm      AB      Living  2     SAB      TAB      Ectopic      Multiple      Live Births                     Patient Active Problem List   Diagnosis Date Noted  . Hx of adenomatous polyp of colon 10/10/2017  . Well woman exam 02/07/2017  . Headache 12/28/2015  . Depression 02/28/2014  . Lupus ([place]) 02/28/2014  . GERD (gastroesophageal reflux disease) 02/28/2014  . Insomnia 02/28/2014  . Obesity 02/28/2014    Past Medical History:  Diagnosis Date  . Allergy   . Anxiety    no meds  .  Depression    no meds  . Frequent headaches   . GERD (gastroesophageal reflux disease)    diet controlled, no meds   . Hx of adenomatous polyp of colon 10/10/2017  . Lupus ([place])    skin lupus  . SVD (spontaneous vaginal delivery)    x 2  . Urinary incontinence          Past Surgical History:  Procedure Laterality Date  . ANKLE SURGERY Left   . CHOLECYSTECTOMY    . COLONOSCOPY    . EYE SURGERY Bilateral    Lasik  . FOOT SURGERY Left   . MOUTH SURGERY     deep clean at gum line  . WISDOM TOOTH EXTRACTION            Current Outpatient Medications  Medication Sig Dispense Refill  . Melatonin 10 MG TABS Take by mouth.    . norethindrone (MICRONOR,CAMILA,ERRIN) 0.35 MG tablet Take 1 tablet by mouth daily.    . traZODone (DESYREL) 100 MG tablet TAKE 1 AND 1/2 TABLETS BY MOUTH AT BEDTIME AS NEEDED FOR SLEEP. NEEDS APPT FOR REFILLS 135 tablet 0   No current facility-administered medications for this visit.      ALLERGIES: Patient has no known allergies.       Family History  Problem Relation Age of Onset  . Hypertension  Mother   . Hyperlipidemia Father   . Heart disease Father   . Hypertension Father   . Diabetes Paternal Grandmother   . Breast cancer Maternal Aunt        age 15's or later  . Colon cancer Maternal Aunt   . Breast cancer Maternal Aunt        age 78's or later  . Breast cancer Maternal Aunt        age 3's or late  . Thyroid disease Sister   . Stroke Maternal Grandmother   . Colon polyps Neg Hx   . Rectal cancer Neg Hx   . Stomach cancer Neg Hx     Social History        Socioeconomic History  . Marital status: Married    Spouse name: Not on file  . Number of children: Not on file  . Years of education: Not on file  . Highest education level: Not on file  Occupational History  . Not on file  Social Needs  . Financial resource strain: Not on file  . Food insecurity:    Worry: Not on file    Inability: Not on file  . Transportation needs:    Medical: Not on file    Non-medical: Not on file  Tobacco Use  . Smoking status: Current Some Day Smoker    Packs/day: 0.25    Types: Cigarettes  . Smokeless tobacco: Never Used  . Tobacco comment: smokes some occasionally   Substance and Sexual Activity  . Alcohol use: Yes    Alcohol/week: 7.0 standard drinks    Types: 7 Glasses of wine per week  . Drug use: No  . Sexual activity: Yes    Birth control/protection: Pill  Lifestyle  . Physical activity:    Days per week: Not on file    Minutes per session: Not on file  . Stress: Not on file  Relationships  . Social connections:    Talks on phone: Not on file    Gets together: Not on file    Attends religious service: Not on file    Active member of  club or organization: Not on file    Attends meetings of clubs or organizations: Not on file    Relationship status: Not on file  . Intimate partner violence:    Fear of current or ex partner: Not on file    Emotionally abused: Not on file    Physically abused:  Not on file    Forced sexual activity: Not on file  Other Topics Concern  . Not on file  Social History Narrative  . Not on file    Review of Systems  Constitutional: Negative.   HENT: Negative.   Eyes: Negative.   Respiratory: Negative.   Cardiovascular: Negative.   Gastrointestinal: Negative.   Endocrine: Negative.   Genitourinary: Negative.   Musculoskeletal: Negative.   Skin: Negative.   Allergic/Immunologic: Negative.   Neurological: Negative.   Hematological: Negative.   Psychiatric/Behavioral: Negative.     PHYSICAL EXAMINATION:    BP 108/60 (BP Location: Right Arm, Patient Position: Sitting, Cuff Size: Large)   Pulse 76   Resp 16   Ht 5\' 4"  (1.626 m)   Wt 199 lb (90.3 kg)   LMP  (LMP Unknown)   BMI 34.16 kg/m     General appearance: alert, cooperative and appears stated age Head: Normocephalic, without obvious abnormality, atraumatic Neck: no adenopathy, supple, symmetrical, trachea midline and thyroid normal to inspection and palpation Lungs: clear to auscultation bilaterally Heart: regular rate and rhythm Abdomen: soft, non-tender, no masses,  no organomegaly Extremities: extremities normal, atraumatic, no cyanosis or edema Skin: Skin color, texture, turgor normal. No rashes or lesions Lymph nodes: Cervical, supraclavicular, and axillary nodes normal. No abnormal inguinal nodes palpated Neurologic: Grossly normal  Pelvic: External genitalia:  no lesions              Urethra:  normal appearing urethra with no masses, tenderness or lesions              Bartholins and Skenes: normal                 Vagina: normal appearing vagina with normal color and discharge, no lesions.  First degree cystocele.  Good apical and posterior vaginal support.               Cervix: no lesions                Bimanual Exam:  Uterus:  normal size, contour, position, consistency, mobility, non-tender              Adnexa: no mass, fullness, tenderness             Chaperone was present for exam.  ASSESSMENT   Urinary stress incontinence.  Cystocele.  Skin lupus.   PLAN  Discussion regarding findings of urodynamics confirming urinary stress incontinence.   We reviewed surgical treatment with midurethral sling, anterior colporrhaphy, and cystoscopy. We discussed permanent mesh materials which are currently the most widely used for the midurethral sling procedure.  I discussed slings being 85 - 90% effective in treatment of stress incontinence.  I discussed risks of surgery including but not limited to erosions and exposure of mesh, dyspareunia, cystotomy and damage to surrounding organs, urinary retention and slower voiding, increase in urgency symptoms, post op catheterization, infections, bleeding, reaction to anesthesia, DVT, PE, death, need for reoperation, and recurrence of prolapse and incontinence.  Surgical expectations and recovery discussed.  Patient wishes to proceed. Questions invited and answered.   An After Visit Summary was printed and given to the patient.  _25_____ minutes face to face time of which over 50% was spent in counseling.

## 2018-10-09 ENCOUNTER — Ambulatory Visit (HOSPITAL_COMMUNITY)
Admission: RE | Admit: 2018-10-09 | Discharge: 2018-10-09 | Disposition: A | Discharge status: Home / Self Care | Payer: 59 | Attending: Obstetrics and Gynecology | Admitting: Obstetrics and Gynecology

## 2018-10-09 ENCOUNTER — Encounter (HOSPITAL_COMMUNITY): Payer: Self-pay

## 2018-10-09 ENCOUNTER — Other Ambulatory Visit: Payer: Self-pay

## 2018-10-09 ENCOUNTER — Ambulatory Visit (HOSPITAL_COMMUNITY): Payer: 59 | Admitting: Certified Registered Nurse Anesthetist

## 2018-10-09 ENCOUNTER — Encounter (HOSPITAL_COMMUNITY)
Admission: RE | Disposition: A | Discharge status: Home / Self Care | Payer: Self-pay | Source: Home / Self Care | Attending: Obstetrics and Gynecology

## 2018-10-09 DIAGNOSIS — F419 Anxiety disorder, unspecified: Secondary | ICD-10-CM | POA: Diagnosis not present

## 2018-10-09 DIAGNOSIS — N8110 Cystocele, unspecified: Secondary | ICD-10-CM

## 2018-10-09 DIAGNOSIS — N393 Stress incontinence (female) (male): Secondary | ICD-10-CM | POA: Diagnosis not present

## 2018-10-09 DIAGNOSIS — F329 Major depressive disorder, single episode, unspecified: Secondary | ICD-10-CM | POA: Diagnosis not present

## 2018-10-09 DIAGNOSIS — F17210 Nicotine dependence, cigarettes, uncomplicated: Secondary | ICD-10-CM | POA: Insufficient documentation

## 2018-10-09 DIAGNOSIS — M329 Systemic lupus erythematosus, unspecified: Secondary | ICD-10-CM | POA: Insufficient documentation

## 2018-10-09 DIAGNOSIS — R51 Headache: Secondary | ICD-10-CM | POA: Diagnosis not present

## 2018-10-09 DIAGNOSIS — Z79899 Other long term (current) drug therapy: Secondary | ICD-10-CM | POA: Diagnosis not present

## 2018-10-09 DIAGNOSIS — K219 Gastro-esophageal reflux disease without esophagitis: Secondary | ICD-10-CM | POA: Insufficient documentation

## 2018-10-09 DIAGNOSIS — Z9889 Other specified postprocedural states: Secondary | ICD-10-CM

## 2018-10-09 HISTORY — PX: BLADDER SUSPENSION: SHX72

## 2018-10-09 HISTORY — PX: CYSTOSCOPY: SHX5120

## 2018-10-09 HISTORY — PX: CYSTOCELE REPAIR: SHX163

## 2018-10-09 LAB — CBC
HCT: 47 % — ABNORMAL HIGH (ref 36.0–46.0)
Hemoglobin: 15.4 g/dL — ABNORMAL HIGH (ref 12.0–15.0)
MCH: 32.3 pg (ref 26.0–34.0)
MCHC: 32.8 g/dL (ref 30.0–36.0)
MCV: 98.5 fL (ref 80.0–100.0)
Platelets: 264 10*3/uL (ref 150–400)
RBC: 4.77 MIL/uL (ref 3.87–5.11)
RDW: 12.4 % (ref 11.5–15.5)
WBC: 7.6 10*3/uL (ref 4.0–10.5)
nRBC: 0 % (ref 0.0–0.2)

## 2018-10-09 LAB — BASIC METABOLIC PANEL
Anion gap: 7 (ref 5–15)
BUN: 11 mg/dL (ref 6–20)
CO2: 24 mmol/L (ref 22–32)
Calcium: 8.7 mg/dL — ABNORMAL LOW (ref 8.9–10.3)
Chloride: 106 mmol/L (ref 98–111)
Creatinine, Ser: 0.72 mg/dL (ref 0.44–1.00)
GFR calc Af Amer: >60 mL/min (ref >60)
GFR calc non Af Amer: >60 mL/min (ref >60)
Glucose, Bld: 90 mg/dL (ref 70–99)
Potassium: 3.9 mmol/L (ref 3.5–5.1)
Sodium: 137 mmol/L (ref 135–145)

## 2018-10-09 LAB — PREGNANCY, URINE: Preg Test, Ur: NEGATIVE

## 2018-10-09 SURGERY — COLPORRHAPHY, ANTERIOR, FOR CYSTOCELE REPAIR
Anesthesia: General | Site: Vagina

## 2018-10-09 MED ORDER — CELECOXIB 200 MG PO CAPS
ORAL_CAPSULE | ORAL | Status: AC
  Administered 2018-10-09: 200 mg via ORAL
  Filled 2018-10-09: qty 1

## 2018-10-09 MED ORDER — CELECOXIB 200 MG PO CAPS
200.0000 mg | ORAL_CAPSULE | Freq: Once | ORAL | Status: AC | PRN
  Administered 2018-10-09: 200 mg via ORAL

## 2018-10-09 MED ORDER — GLYCOPYRROLATE 0.2 MG/ML IJ SOLN
INTRAMUSCULAR | Status: AC
  Filled 2018-10-09: qty 1

## 2018-10-09 MED ORDER — ONDANSETRON HCL 4 MG/2ML IJ SOLN
INTRAMUSCULAR | Status: DC | PRN
  Administered 2018-10-09 (×2): 2 mg via INTRAVENOUS

## 2018-10-09 MED ORDER — SUGAMMADEX SODIUM 200 MG/2ML IV SOLN
INTRAVENOUS | Status: AC
  Filled 2018-10-09: qty 2

## 2018-10-09 MED ORDER — SCOPOLAMINE 1 MG/3DAYS TD PT72
MEDICATED_PATCH | TRANSDERMAL | Status: AC
  Administered 2018-10-09: 1.5 mg via TRANSDERMAL
  Filled 2018-10-09: qty 1

## 2018-10-09 MED ORDER — KETOROLAC TROMETHAMINE 30 MG/ML IJ SOLN
INTRAMUSCULAR | Status: DC | PRN
  Administered 2018-10-09: 30 mg via INTRAVENOUS

## 2018-10-09 MED ORDER — GLYCOPYRROLATE 0.2 MG/ML IJ SOLN
INTRAMUSCULAR | Status: DC | PRN
  Administered 2018-10-09 (×2): 0.1 mg via INTRAVENOUS

## 2018-10-09 MED ORDER — SUGAMMADEX SODIUM 200 MG/2ML IV SOLN
INTRAVENOUS | Status: DC | PRN
  Administered 2018-10-09: 200 mg via INTRAVENOUS

## 2018-10-09 MED ORDER — FENTANYL CITRATE (PF) 250 MCG/5ML IJ SOLN
INTRAMUSCULAR | Status: AC
  Filled 2018-10-09: qty 5

## 2018-10-09 MED ORDER — STERILE WATER FOR IRRIGATION IR SOLN
Status: DC | PRN
  Administered 2018-10-09: 1000 mL

## 2018-10-09 MED ORDER — ACETAMINOPHEN 160 MG/5ML PO SOLN
960.0000 mg | Freq: Once | ORAL | Status: AC
  Administered 2018-10-09: 960 mg via ORAL

## 2018-10-09 MED ORDER — OXYCODONE HCL 5 MG/5ML PO SOLN: 5.0000 mg | Freq: Once | ORAL | Status: DC | PRN

## 2018-10-09 MED ORDER — HYDROMORPHONE HCL 1 MG/ML IJ SOLN: 0.2500 mg | INTRAMUSCULAR | Status: DC | PRN

## 2018-10-09 MED ORDER — SODIUM CHLORIDE 0.9 % IV SOLN
INTRAVENOUS | Status: AC
  Filled 2018-10-09: qty 2

## 2018-10-09 MED ORDER — PHENYLEPHRINE HCL 10 MG/ML IJ SOLN
INTRAMUSCULAR | Status: DC | PRN
  Administered 2018-10-09 (×2): .04 mg via INTRAVENOUS
  Administered 2018-10-09: .08 mg via INTRAVENOUS

## 2018-10-09 MED ORDER — LIDOCAINE HCL (CARDIAC) PF 100 MG/5ML IV SOSY
PREFILLED_SYRINGE | INTRAVENOUS | Status: AC
  Filled 2018-10-09: qty 5

## 2018-10-09 MED ORDER — ACETAMINOPHEN 500 MG PO TABS: 1000.0000 mg | ORAL_TABLET | Freq: Once | ORAL | Status: AC

## 2018-10-09 MED ORDER — PHENYLEPHRINE 40 MCG/ML (10ML) SYRINGE FOR IV PUSH (FOR BLOOD PRESSURE SUPPORT)
PREFILLED_SYRINGE | INTRAVENOUS | Status: AC
  Filled 2018-10-09: qty 10

## 2018-10-09 MED ORDER — FAMOTIDINE 20 MG PO TABS
ORAL_TABLET | ORAL | Status: AC
  Administered 2018-10-09: 20 mg via ORAL
  Filled 2018-10-09: qty 1

## 2018-10-09 MED ORDER — ROCURONIUM BROMIDE 100 MG/10ML IV SOLN
INTRAVENOUS | Status: DC | PRN
  Administered 2018-10-09 (×2): 10 mg via INTRAVENOUS
  Administered 2018-10-09: 30 mg via INTRAVENOUS

## 2018-10-09 MED ORDER — ACETAMINOPHEN 160 MG/5ML PO SOLN
ORAL | Status: AC
  Administered 2018-10-09: 960 mg via ORAL
  Filled 2018-10-09: qty 40.6

## 2018-10-09 MED ORDER — MIDAZOLAM HCL 2 MG/2ML IJ SOLN
INTRAMUSCULAR | Status: DC | PRN
  Administered 2018-10-09: 0.5 mg via INTRAVENOUS
  Administered 2018-10-09: 1.5 mg via INTRAVENOUS

## 2018-10-09 MED ORDER — ESTRADIOL 0.1 MG/GM VA CREA
TOPICAL_CREAM | VAGINAL | Status: AC
  Filled 2018-10-09: qty 42.5

## 2018-10-09 MED ORDER — PROPOFOL 10 MG/ML IV BOLUS
INTRAVENOUS | Status: AC
  Filled 2018-10-09: qty 20

## 2018-10-09 MED ORDER — DEXAMETHASONE SODIUM PHOSPHATE 10 MG/ML IJ SOLN
INTRAMUSCULAR | Status: DC | PRN
  Administered 2018-10-09: 10 mg via INTRAVENOUS

## 2018-10-09 MED ORDER — SODIUM CHLORIDE 0.9 % IV SOLN
2.0000 g | INTRAVENOUS | Status: AC
  Administered 2018-10-09: 2 g via INTRAVENOUS

## 2018-10-09 MED ORDER — ONDANSETRON HCL 4 MG/2ML IJ SOLN
INTRAMUSCULAR | Status: AC
  Filled 2018-10-09: qty 2

## 2018-10-09 MED ORDER — KETOROLAC TROMETHAMINE 30 MG/ML IJ SOLN
INTRAMUSCULAR | Status: AC
  Filled 2018-10-09: qty 1

## 2018-10-09 MED ORDER — MIDAZOLAM HCL 2 MG/2ML IJ SOLN
INTRAMUSCULAR | Status: AC
  Filled 2018-10-09: qty 2

## 2018-10-09 MED ORDER — FAMOTIDINE 20 MG PO TABS
20.0000 mg | ORAL_TABLET | Freq: Once | ORAL | Status: AC
  Administered 2018-10-09: 20 mg via ORAL

## 2018-10-09 MED ORDER — LIDOCAINE HCL (CARDIAC) PF 100 MG/5ML IV SOSY
PREFILLED_SYRINGE | INTRAVENOUS | Status: DC | PRN
  Administered 2018-10-09: 60 mg via INTRAVENOUS

## 2018-10-09 MED ORDER — OXYCODONE-ACETAMINOPHEN 5-325 MG PO TABS: 1.0000 | ORAL_TABLET | ORAL | 0 refills | Status: DC | PRN

## 2018-10-09 MED ORDER — DEXAMETHASONE SODIUM PHOSPHATE 10 MG/ML IJ SOLN
INTRAMUSCULAR | Status: AC
  Filled 2018-10-09: qty 1

## 2018-10-09 MED ORDER — PROPOFOL 10 MG/ML IV BOLUS
INTRAVENOUS | Status: DC | PRN
  Administered 2018-10-09: 200 mg via INTRAVENOUS

## 2018-10-09 MED ORDER — LIDOCAINE-EPINEPHRINE 1 %-1:100000 IJ SOLN
INTRAMUSCULAR | Status: DC | PRN
  Administered 2018-10-09: 9 mL

## 2018-10-09 MED ORDER — PROMETHAZINE HCL 25 MG/ML IJ SOLN: 6.2500 mg | INTRAMUSCULAR | Status: DC | PRN

## 2018-10-09 MED ORDER — ROCURONIUM BROMIDE 100 MG/10ML IV SOLN
INTRAVENOUS | Status: AC
  Filled 2018-10-09: qty 1

## 2018-10-09 MED ORDER — LACTATED RINGERS IV SOLN
INTRAVENOUS | Status: DC
  Administered 2018-10-09: 125 mL/h via INTRAVENOUS
  Administered 2018-10-09: 12:00:00 via INTRAVENOUS

## 2018-10-09 MED ORDER — FENTANYL CITRATE (PF) 100 MCG/2ML IJ SOLN
INTRAMUSCULAR | Status: DC | PRN
  Administered 2018-10-09: 100 ug via INTRAVENOUS
  Administered 2018-10-09 (×3): 50 ug via INTRAVENOUS

## 2018-10-09 MED ORDER — SCOPOLAMINE 1 MG/3DAYS TD PT72
1.0000 | MEDICATED_PATCH | Freq: Once | TRANSDERMAL | Status: DC
  Administered 2018-10-09: 1.5 mg via TRANSDERMAL

## 2018-10-09 MED ORDER — IBUPROFEN 800 MG PO TABS: 800.0000 mg | ORAL_TABLET | Freq: Three times a day (TID) | ORAL | 0 refills | Status: DC | PRN

## 2018-10-09 MED ORDER — LIDOCAINE-EPINEPHRINE 1 %-1:100000 IJ SOLN
INTRAMUSCULAR | Status: AC
  Filled 2018-10-09: qty 1

## 2018-10-09 MED ORDER — OXYCODONE HCL 5 MG PO TABS: 5.0000 mg | ORAL_TABLET | Freq: Once | ORAL | Status: DC | PRN

## 2018-10-09 SURGICAL SUPPLY — 49 items
ADH SKN CLS APL DERMABOND .7 (GAUZE/BANDAGES/DRESSINGS) ×2
AGENT HMST KT MTR STRL THRMB (HEMOSTASIS) ×2
BLADE SURG 11 STRL SS (BLADE) ×4 IMPLANT
BLADE SURG 15 STRL LF C SS BP (BLADE) ×2 IMPLANT
BLADE SURG 15 STRL SS (BLADE) ×4
BLADE SURG SZ11 CARB STEEL (BLADE) IMPLANT
CANISTER SUCT 3000ML PPV (MISCELLANEOUS) ×4 IMPLANT
CATH FOLEY 2WAY SLVR  5CC 18FR (CATHETERS) ×2
CATH FOLEY 2WAY SLVR 5CC 18FR (CATHETERS) ×2 IMPLANT
CONT PATH 16OZ SNAP LID 3702 (MISCELLANEOUS) IMPLANT
DECANTER SPIKE VIAL GLASS SM (MISCELLANEOUS) ×4 IMPLANT
DERMABOND ADVANCED (GAUZE/BANDAGES/DRESSINGS) ×2
DERMABOND ADVANCED .7 DNX12 (GAUZE/BANDAGES/DRESSINGS) ×2 IMPLANT
DEVICE CAPIO SLIM SINGLE (INSTRUMENTS) IMPLANT
DURAPREP 26ML APPLICATOR (WOUND CARE) ×4 IMPLANT
GAUZE PACKING 2X5 YD STRL (GAUZE/BANDAGES/DRESSINGS) ×4 IMPLANT
GLOVE BIO SURGEON STRL SZ 6.5 (GLOVE) ×3 IMPLANT
GLOVE BIO SURGEONS STRL SZ 6.5 (GLOVE) ×1
GLOVE BIOGEL PI IND STRL 6.5 (GLOVE) ×2 IMPLANT
GLOVE BIOGEL PI IND STRL 7.0 (GLOVE) ×2 IMPLANT
GLOVE BIOGEL PI INDICATOR 6.5 (GLOVE) ×2
GLOVE BIOGEL PI INDICATOR 7.0 (GLOVE) ×2
GOWN STRL REUS W/TWL LRG LVL3 (GOWN DISPOSABLE) ×16 IMPLANT
HIBICLENS CHG 4% 4OZ BTL (MISCELLANEOUS) ×4 IMPLANT
NDL MAYO 6 CRC TAPER PT (NEEDLE) IMPLANT
NEEDLE HYPO 22GX1.5 SAFETY (NEEDLE) ×4 IMPLANT
NEEDLE MAYO 6 CRC TAPER PT (NEEDLE) IMPLANT
NS IRRIG 1000ML POUR BTL (IV SOLUTION) ×4 IMPLANT
PACK VAGINAL WOMENS (CUSTOM PROCEDURE TRAY) ×4 IMPLANT
PAD MAGNETIC INST (MISCELLANEOUS) IMPLANT
SET CYSTO W/LG BORE CLAMP LF (SET/KITS/TRAYS/PACK) ×4 IMPLANT
SLING TVT EXACT (Sling) ×2 IMPLANT
SPONGE SURGIFOAM ABS GEL 12-7 (HEMOSTASIS) IMPLANT
SURGIFLO W/THROMBIN 8M KIT (HEMOSTASIS) ×2 IMPLANT
SUT CAPIO ETHIBPND (SUTURE) IMPLANT
SUT VIC AB 0 CT1 18XCR BRD8 (SUTURE) ×2 IMPLANT
SUT VIC AB 0 CT1 27 (SUTURE) ×12
SUT VIC AB 0 CT1 27XBRD ANBCTR (SUTURE) ×6 IMPLANT
SUT VIC AB 0 CT1 8-18 (SUTURE) ×4
SUT VIC AB 2-0 CT1 27 (SUTURE)
SUT VIC AB 2-0 CT1 TAPERPNT 27 (SUTURE) IMPLANT
SUT VIC AB 2-0 CT2 27 (SUTURE) IMPLANT
SUT VIC AB 2-0 SH 27 (SUTURE) ×16
SUT VIC AB 2-0 SH 27XBRD (SUTURE) ×8 IMPLANT
SUT VIC AB 2-0 UR6 27 (SUTURE) IMPLANT
TOWEL OR 17X24 6PK STRL BLUE (TOWEL DISPOSABLE) ×8 IMPLANT
TOWEL OR 17X26 10 PK STRL BLUE (TOWEL DISPOSABLE) ×8 IMPLANT
TRAY FOLEY MTR SLVR 14FR STAT (SET/KITS/TRAYS/PACK) ×2 IMPLANT
TRAY FOLEY W/BAG SLVR 14FR (SET/KITS/TRAYS/PACK) ×4 IMPLANT

## 2018-10-09 NOTE — Anesthesia Preprocedure Evaluation (Addendum)
Anesthesia Evaluation  Patient identified by MRN, date of birth, ID band Patient awake    Reviewed: Allergy & Precautions, NPO status , Patient's Chart, lab work & pertinent test results  Airway Mallampati: II  TM Distance: >3 FB Neck ROM: Full    Dental no notable dental hx.    Pulmonary Current Smoker,    Pulmonary exam normal breath sounds clear to auscultation       Cardiovascular negative cardio ROS Normal cardiovascular exam Rhythm:Regular Rate:Normal     Neuro/Psych  Headaches, PSYCHIATRIC DISORDERS Anxiety Depression    GI/Hepatic Neg liver ROS, GERD  Controlled,  Endo/Other  Lupus   Renal/GU negative Renal ROS     Musculoskeletal negative musculoskeletal ROS (+)   Abdominal (+) + obese,   Peds  Hematology negative hematology ROS (+)   Anesthesia Other Findings Female bladder prolapse SUI  Reproductive/Obstetrics                           Anesthesia Physical Anesthesia Plan  ASA: II  Anesthesia Plan: General   Post-op Pain Management:    Induction: Intravenous  PONV Risk Score and Plan: 3 and Midazolam, Dexamethasone, Ondansetron, Treatment may vary due to age or medical condition and Scopolamine patch - Pre-op  Airway Management Planned: Oral ETT and LMA  Additional Equipment:   Intra-op Plan:   Post-operative Plan: Extubation in OR  Informed Consent: I have reviewed the patients History and Physical, chart, labs and discussed the procedure including the risks, benefits and alternatives for the proposed anesthesia with the patient or authorized representative who has indicated his/her understanding and acceptance.   Dental advisory given  Plan Discussed with: CRNA  Anesthesia Plan Comments:        Anesthesia Quick Evaluation

## 2018-10-09 NOTE — Transfer of Care (Signed)
Immediate Anesthesia Transfer of Care Note  Patient: Jane Valdez  Procedure(s) Performed: ANTERIOR REPAIR (CYSTOCELE) (N/A Vagina ) TRANSVAGINAL TAPE (TVT) PROCEDURE exact mid-urethral sling (N/A Vagina ) CYSTOSCOPY (N/A Urethra)  Patient Location: PACU  Anesthesia Type:General  Level of Consciousness: awake, alert  and oriented  Airway & Oxygen Therapy: Patient Spontanous Breathing and Patient connected to nasal cannula oxygen  Post-op Assessment: Report given to RN, Post -op Vital signs reviewed and stable and Patient moving all extremities X 4  Post vital signs: Reviewed and stable  Last Vitals:  Vitals Value Taken Time  BP 119/68 10/09/2018  1:03 PM  Temp    Pulse 94 10/09/2018  1:06 PM  Resp 17 10/09/2018  1:06 PM  SpO2 100 % 10/09/2018  1:06 PM  Vitals shown include unvalidated device data.  Last Pain:  Vitals:   10/09/18 1027  TempSrc: Oral  PainSc: 0-No pain      Patients Stated Pain Goal: 3 (97/94/80 1655)  Complications: No apparent anesthesia complications

## 2018-10-09 NOTE — Anesthesia Procedure Notes (Signed)
Procedure Name: Intubation Date/Time: 10/09/2018 11:32 AM Performed by: Murvin Natal, MD Pre-anesthesia Checklist: Patient identified, Patient being monitored, Timeout performed, Emergency Drugs available and Suction available Patient Re-evaluated:Patient Re-evaluated prior to induction Oxygen Delivery Method: Circle System Utilized Preoxygenation: Pre-oxygenation with 100% oxygen Induction Type: IV induction Ventilation: Mask ventilation without difficulty Laryngoscope Size: Miller and 2 Grade View: Grade II Tube type: Oral Tube size: 7.0 mm Number of attempts: 1 Airway Equipment and Method: stylet Placement Confirmation: ETT inserted through vocal cords under direct vision,  positive ETCO2 and breath sounds checked- equal and bilateral Secured at: 21 cm Tube secured with: Tape Dental Injury: Teeth and Oropharynx as per pre-operative assessment

## 2018-10-09 NOTE — Progress Notes (Signed)
Update to History and Physical  No marked change in status since office preop visit.  Ca 8.7. Patient examined.  OK to proceed with surgery.

## 2018-10-09 NOTE — Anesthesia Postprocedure Evaluation (Signed)
Anesthesia Post Note  Patient: Jane Valdez  Procedure(s) Performed: ANTERIOR REPAIR (CYSTOCELE) (N/A Vagina ) TRANSVAGINAL TAPE (TVT) PROCEDURE exact mid-urethral sling (N/A Vagina ) CYSTOSCOPY (N/A Urethra)     Patient location during evaluation: PACU Anesthesia Type: General Level of consciousness: awake and alert Pain management: pain level controlled Vital Signs Assessment: post-procedure vital signs reviewed and stable Respiratory status: spontaneous breathing, nonlabored ventilation, respiratory function stable and patient connected to nasal cannula oxygen Cardiovascular status: blood pressure returned to baseline and stable Postop Assessment: no apparent nausea or vomiting Anesthetic complications: no    Last Vitals:  Vitals:   10/09/18 1345 10/09/18 1455  BP: 125/74 131/78  Pulse: 77 83  Resp: 15 16  Temp:  36.4 C  SpO2: 98% 100%    Last Pain:  Vitals:   10/09/18 1455  TempSrc:   PainSc: 0-No pain   Pain Goal: Patients Stated Pain Goal: 3 (10/09/18 1027)               Khyler Urda P Chemika [place]

## 2018-10-09 NOTE — Discharge Instructions (Signed)
Post Anesthesia Home Care Instructions  Activity: Get plenty of rest for the remainder of the day. A responsible individual must stay with you for 24 hours following the procedure.  For the next 24 hours, DO NOT: -Drive a car -Paediatric nurse -Drink alcoholic beverages -Take any medication unless instructed by your physician -Make any legal decisions or sign important papers.  Meals: Start with liquid foods such as gelatin or soup. Progress to regular foods as tolerated. Avoid greasy, spicy, heavy foods. If nausea and/or vomiting occur, drink only clear liquids until the nausea and/or vomiting subsides. Call your physician if vomiting continues.  Special Instructions/Symptoms: Your throat may feel dry or sore from the anesthesia or the breathing tube placed in your throat during surgery. If this causes discomfort, gargle with warm salt water. The discomfort should disappear within 24 hours.  If you had a scopolamine patch placed behind your ear for the management of post- operative nausea and/or vomiting:  1. The medication in the patch is effective for 72 hours, after which it should be removed.  Wrap patch in a tissue and discard in the trash. Wash hands thoroughly with soap and water. 2. You may remove the patch earlier than 72 hours if you experience unpleasant side effects which may include dry mouth, dizziness or visual disturbances. 3. Avoid touching the patch. Wash your hands with soap and water after contact with the patch.   Anterior and Colporrhaphy and Sling Procedure, Care After This sheet gives you information about how to care for yourself after your procedure. Your health care provider may also give you more specific instructions. If you have problems or questions, contact your health care provider. What can I expect after the procedure? After the procedure, it is common to have:  Pain in the surgical area.  Vaginal discharge. You will need to use a sanitary pad  during this time.  Fatigue.  Follow these instructions at home: Incision care  Follow instructions from your health care provider about how to take care of your incision. Make sure you: ? Wash your hands with soap and water before touching the incision area. If soap and water are not available, use hand sanitizer. ? Clean your incision as told by your health care provider. ? Leave stitches (sutures), skin glue, or adhesive strips in place. These skin closures may need to stay in place for 2 weeks or longer. If adhesive strip edges start to loosen and curl up, you may trim the loose edges. Do not remove adhesive strips completely unless your health care provider tells you to do that.  Check your incision area every day for signs of infection. Check for: ? Redness, swelling, or pain. ? Fluid or blood. ? Warmth. ? Pus or a bad smell.  Check your incision every day to make sure the incision area is not separating or opening.  Do not take baths, swim, or use a hot tub until your health care provider approves. You may shower.  Keep the area between your vagina and rectum (perineal area) clean and dry. Make sure you clean the area after each bowel movement and each time you urinate.  Ask your health care provider if you can take a sitz bath or sit in a tub of clean, warm water. Activity  Do gentle, daily activity as told by your health care provider. You may be told to take short walks every day and go farther each time. Ask your health care provider what activities are safe for  you.  Limit stair climbing to once or twice a day in the first week, then slowly increase this activity.  Do not lift anything that is heavier than 10 lbs. (4.5 kg), or the limit that your health care provider tells you, until he or she says that it is safe. Avoid pushing or pulling motions.  Avoid standing for long periods of time.  Do not douche, use tampons, or have sex until your health care provider says it  is okay.  Do not drive or use heavy machinery while taking prescription pain medicine. To prevent constipation  To prevent or treat constipation while you are taking prescription pain medicine, your health care provider may recommend that you: ? Take over-the-counter or prescription medicines. ? Eat foods that are high in fiber, such as fresh fruits and vegetables, whole grains, and beans. ? Drink enough fluid to keep your urine clear or pale yellow. ? Limit foods that are high in fat and processed sugars, such as fried and sweet foods. General instructions  You may be instructed to do pelvic floor exercises (kegels) as told by your health care provider.  Take over-the-counter and prescription medicines only as told by your health care provider.  Keep all follow-up visits as told by your health care provider. This is important. Contact a health care provider if:  Medicine does not help your pain.  You have frequent or urgent urination, or you are unable to completely empty your bladder.  You feel a burning sensation when urinating.  You have fluid or blood coming from your incision.  You have pus or a bad smell coming from the incision.  Your incision feels warm to the touch.  You have redness, swelling, or pain around your incision. Get help right away if:  You have a fever or chills.  Your incision separates or opens.  You cannot urinate.  You have trouble breathing. Summary  After the procedure, it is common to have pain, fatigue, and discharge from the vagina.  Keep the area between your vagina and rectum (perineal area) clean and dry. Make sure you clean the area after each bowel movement and each time you urinate.  Follow instructions from your health care provider on any activity restrictions after the procedure. This information is not intended to replace advice given to you by your health care provider. Make sure you discuss any questions you have with your  health care provider. Document Released: 05/24/2004 Document Revised: 10/10/2016 Document Reviewed: 10/10/2016 Elsevier Interactive Patient Education  2017 [employer] Urethral Vaginal Sling, Care After Refer to this sheet in the next few weeks. These instructions provide you with information on caring for yourself after your procedure. Your health care provider may also give you more specific instructions. Your treatment has been planned according to current medical practices, but problems sometimes occur. Call your health care provider if you have any problems or questions after your procedure. What can I expect after the procedure? After your procedure, it is typical to have the following:  A catheter in your bladder until your bladder is able to work on its own properly. You will be instructed on how to empty the catheter bag.  Absorbable stitches in your incisions. They will slowly dissolve over 1-2 months.  Follow these instructions at home:  Get plenty of rest.  Only take over-the-counter or prescription medicines as directed by your health care provider. Do not take aspirin because it can cause bleeding.  Do not take baths. Take  showers until your health care provider tells you otherwise.  You may resume your usual diet. Eat a well-balanced diet.  Drink enough fluids to keep your urine clear or pale yellow.  Limit exercise and activities as directed by your health care provider. Do not lift anything heavier than 5 pounds (2.3 kg).  Do not douche, use tampons, or have sexual intercourse for 6 weeks after your procedure.  Follow up with your health care provider as directed. Contact a health care provider if:  You have a heavy or bad smelling vaginal discharge.  You have a rash.  You have pain that is not controlled with medicines.  You have lightheadedness or feel faint. Get help right away if:  You have a fever.  You have vaginal bleeding.  You faint.  You  have shortness of breath.  You have chest, abdominal, or leg pain.  You have pain when urinating or cannot urinate.  Your catheter is still in your bladder and becomes blocked.  You have swelling, redness, and pain in the vaginal area. This information is not intended to replace advice given to you by your health care provider. Make sure you discuss any questions you have with your health care provider. Document Released: 07/31/2013 Document Revised: 03/17/2016 Document Reviewed: 03/29/2013 Elsevier Interactive Patient Education  2018 [employer].  Urethral Vaginal Sling, Care After Refer to this sheet in the next few weeks. These instructions provide you with information on caring for yourself after your procedure. Your health care provider may also give you more specific instructions. Your treatment has been planned according to current medical practices, but problems sometimes occur. Call your health care provider if you have any problems or questions after your procedure. What can I expect after the procedure? After your procedure, it is typical to have the following:  A catheter in your bladder until your bladder is able to work on its own properly. You will be instructed on how to empty the catheter bag.  Absorbable stitches in your incisions. They will slowly dissolve over 1-2 months.  Follow these instructions at home:  Get plenty of rest.  Only take over-the-counter or prescription medicines as directed by your health care provider. Do not take aspirin because it can cause bleeding.  Do not take baths. Take showers until your health care provider tells you otherwise.  You may resume your usual diet. Eat a well-balanced diet.  Drink enough fluids to keep your urine clear or pale yellow.  Limit exercise and activities as directed by your health care provider. Do not lift anything heavier than 5 pounds (2.3 kg).  Do not douche, use tampons, or have sexual intercourse for 6  weeks after your procedure.  Follow up with your health care provider as directed. Contact a health care provider if:  You have a heavy or bad smelling vaginal discharge.  You have a rash.  You have pain that is not controlled with medicines.  You have lightheadedness or feel faint. Get help right away if:  You have a fever.  You have vaginal bleeding.  You faint.  You have shortness of breath.  You have chest, abdominal, or leg pain.  You have pain when urinating or cannot urinate.  Your catheter is still in your bladder and becomes blocked.  You have swelling, redness, and pain in the vaginal area. This information is not intended to replace advice given to you by your health care provider. Make sure you discuss any questions you have  with your health care provider. Document Released: 07/31/2013 Document Revised: 03/17/2016 Document Reviewed: 03/29/2013 Elsevier Interactive Patient Education  [employer].

## 2018-10-09 NOTE — Op Note (Signed)
OPERATIVE REPORT   PREOPERATIVE DIAGNOSES:   Genuine stress incontinence, cystocele  POSTOPERATIVE DIAGNOSES:   Genuine stress incontinence, cystocele  PROCEDURES:  TVT Exact midurethral sling, anterior colporrhaphy, and cystoscopy.  SURGEON:  Lenard Galloway, M.D.  ASSISTANT:    Lyman Speller, M.D.  ANESTHESIA:  General endotracheal, local with 1% lidocaine with epinephrine, 1:100,000.  EBL: 100 cc  URINE OUTPUT:  75 cc   IV FLUIDS:   3875 cc LR  COMPLICATIONS:  None.  INDICATIONS FOR THE PROCEDURE:  The patient is a 23 old Gravida 2, Para 2 who presents with urinary incontinence with exercise, laughing, coughing, and sneezing.  On physical exam, the patient was noted to have a first degree cystocele.  The patient underwent multichannel urodynamic testing, and she was diagnosed with genuine stress incontinence.  The patient is wishing for surgical repair and a plan is made to proceed now with an anterior colporrhaphy along with the TVT Exact midurethral sling and cystoscopy.  Risks, benefits, and alternatives are reviewed with the patient, who wishes to proceed.  FINDINGS:  Examination under anesthesia revealed   The bladder was visualized throughout 360 degrees and was normal.  There  was no foreign body in the bladder or the urethra.  The ureters were noted to  be patent bilaterally.  SPECIMENS:  None.  DESCRIPTION OF PROCEDURE:  The patient was reidentified in the preoperative hold area.  She received Cefotetan IV for antibiotic prophylaxis. She received TED hose and PAS stockings for DVT prophylaxis.  The patient was transferred to the operating room where she was placed in the dorsal lithotomy position with Allen stirrups.  General endotracheal anesthesia was induced.  The patient's lower abdomen, vagina and perineum were then sterilely prepped and she was draped.  A Foley catheter was sterilely placed inside the bladder and left to gravity drainage throughout  the procedure.  An examination under anesthesia was performed.  Allis clamps were used to mark the anterior vaginal wall from 1 cm below the urethra to the vaginal apex.  The anterior vaginal wall mucosa was injected locally with 1% lidocaine with epinephrine, 1:100,000.  The vaginal mucosa was then incised vertically in the midline with the scalpel.  With a combination of sharp and blunt dissection, the subvaginal tissue was dissected off the bladder bilaterally.  The dissection was carried back to the pubic rami anteriorly.   The TVT Exact midurethral sling was performed.  The 1 cm suprapubic incisions were created with a scalpel to the right and left of the midline.  The TVT Exact was performed in a bottom-up fashion.  The Foley catheter was removed and the Foley tip with the obturator guide was placed inside the urethra and deflected properly.  The guide was placed through the right retropubic space and then up through the right suprapubic incision.  This was performed without difficulty.  The urethra was deflected in opposite direction and the same was then performed on the patient's left-hand side.  The obturator guide was removed and cystoscopy was performed and the findings were as noted Above.  Sterile water and then D10W were used to confirm patency of the  bilateral ureters.   All cystoscopic fluid was drained and the Foley catheter was replaced. The sling was brought up through the suprapubic incisions bilaterally. A Kelly clamp was placed between the sling and the urethra, and the plastic sheaths were removed.  The sling was trimmed suprapubically. The sling was noted to be in good position.  There were some veins over the bladder that bled with the dissection.  Monopolar cautery was used to create hemostasis The exit sites of the sling  bled in the retropubic areas bled with placement of the sling arms.  Surgiflo  was placed in these regions, and then hemostasis was  good.  A single suture of 0/0 Vicryl was used to reduce the cystocele.  The anterior vaginal wall mucosa was trimmed and then the anterior vaginal wall was closed with a running locked suture of 2-0 Vicryl.  The suprapubic incisions were closed with Dermabond.  The bladder was retrograde filled through the Foley catheter with 200 cc of sterile water.  The foley catheter was removed.  The patient was awakened and extubated, and escorted to the recovery room in stable condition.  There were no complications.  All needle, instrument, and sponge counts were correct.   Lenard Galloway, M.D.

## 2018-10-10 ENCOUNTER — Encounter (HOSPITAL_COMMUNITY): Payer: Self-pay | Admitting: Obstetrics and Gynecology

## 2018-10-12 NOTE — Progress Notes (Deleted)
GYNECOLOGY  VISIT   HPI: 52 y.o.   Married  Caucasian  female   G2P2 with No LMP recorded (lmp unknown). (Menstrual status: Oral contraceptives).   here for 1 week follow up ANTERIOR REPAIR (CYSTOCELE) (N/A Vagina ) TRANSVAGINAL TAPE (TVT) PROCEDURE exact mid-urethral sling (N/A Vagina ) CYSTOSCOPY (N/A Urethra).    GYNECOLOGIC HISTORY: No LMP recorded (lmp unknown). (Menstrual status: Oral contraceptives). Contraception: OCPs Menopausal hormone therapy: none Last mammogram:  04/12/17 Bilateral MMG/Left Breast US - BIRADS 2 benign/density b Last pap smear:   06/2018 normal per patient with [facility]        OB History    Gravida  2   Para  2   Term      Preterm      AB      Living  2     SAB      TAB      Ectopic      Multiple      Live Births                 Patient Active Problem List   Diagnosis Date Noted  . Hx of adenomatous polyp of colon 10/10/2017  . Well woman exam 02/07/2017  . Headache 12/28/2015  . Depression 02/28/2014  . Lupus ([place]) 02/28/2014  . GERD (gastroesophageal reflux disease) 02/28/2014  . Insomnia 02/28/2014  . Obesity 02/28/2014    Past Medical History:  Diagnosis Date  . Allergy   . Anxiety    no meds  . Depression    no meds  . Frequent headaches   . GERD (gastroesophageal reflux disease)    diet controlled, no meds   . Hx of adenomatous polyp of colon 10/10/2017  . Lupus ([place])    skin lupus  . SVD (spontaneous vaginal delivery)    x 2  . Urinary incontinence     Past Surgical History:  Procedure Laterality Date  . ANKLE SURGERY Left   . BLADDER SUSPENSION N/A 10/09/2018   Procedure: TRANSVAGINAL TAPE (TVT) PROCEDURE exact mid-urethral sling;  Surgeon: Nunzio Cobbs, MD;  Location: [facility];  Service: Gynecology;  Laterality: N/A;  . CHOLECYSTECTOMY    . COLONOSCOPY    . CYSTOCELE REPAIR N/A 10/09/2018   Procedure: ANTERIOR REPAIR (CYSTOCELE);  Surgeon: Nunzio Cobbs, MD;  Location: [facility];  Service: Gynecology;  Laterality: N/A;  . CYSTOSCOPY N/A 10/09/2018   Procedure: CYSTOSCOPY;  Surgeon: Nunzio Cobbs, MD;  Location: [facility];  Service: Gynecology;  Laterality: N/A;  . EYE SURGERY Bilateral    Lasik  . FOOT SURGERY Left   . MOUTH SURGERY     deep clean at gum line  . WISDOM TOOTH EXTRACTION      Current Outpatient Medications  Medication Sig Dispense Refill  . ibuprofen (ADVIL,MOTRIN) 800 MG tablet Take 1 tablet (800 mg total) by mouth every 8 (eight) hours as needed. 30 tablet 0  . Melatonin 10 MG TABS Take 10 mg by mouth at bedtime as needed (sleep).     . norethindrone (MICRONOR,CAMILA,ERRIN) 0.35 MG tablet Take 1 tablet by mouth every evening.     Marland Kitchen oxyCODONE-acetaminophen (PERCOCET) 5-325 MG tablet Take 1 tablet by mouth every 4 (four) hours as needed for severe pain. 20 tablet 0  . Polyvinyl Alcohol-Povidone (REFRESH OP) Place 1 drop into both eyes daily as needed (dry eyes).    . traZODone (DESYREL) 100 MG tablet TAKE 1 AND  1/2 TABLETS BY MOUTH AT BEDTIME AS NEEDED FOR SLEEP. NEEDS APPT FOR REFILLS (Patient taking differently: Take 150 mg by mouth at bedtime as needed for sleep. ) 135 tablet 0   No current facility-administered medications for this visit.      ALLERGIES: Patient has no known allergies.  Family History  Problem Relation Age of Onset  . Hypertension Mother   . Hyperlipidemia Father   . Heart disease Father   . Hypertension Father   . Diabetes Paternal Grandmother   . Breast cancer Maternal Aunt        age 52's or later  . Colon cancer Maternal Aunt   . Breast cancer Maternal Aunt        age 59's or later  . Breast cancer Maternal Aunt        age 65's or late  . Thyroid disease Sister   . Stroke Maternal Grandmother   . Colon polyps Neg Hx   . Rectal cancer Neg Hx   . Stomach cancer Neg Hx     Social History   Socioeconomic History  . Marital status: Married    Spouse name: Not on file  . Number of children: Not  on file  . Years of education: Not on file  . Highest education level: Not on file  Occupational History  . Not on file  Social Needs  . Financial resource strain: Not on file  . Food insecurity:    Worry: Not on file    Inability: Not on file  . Transportation needs:    Medical: Not on file    Non-medical: Not on file  Tobacco Use  . Smoking status: Current Some Day Smoker    Packs/day: 0.25    Types: Cigarettes  . Smokeless tobacco: Never Used  . Tobacco comment: smokes some occasionally   Substance and Sexual Activity  . Alcohol use: Yes    Alcohol/week: 7.0 standard drinks    Types: 7 Glasses of wine per week  . Drug use: No  . Sexual activity: Yes    Birth control/protection: Pill  Lifestyle  . Physical activity:    Days per week: Not on file    Minutes per session: Not on file  . Stress: Not on file  Relationships  . Social connections:    Talks on phone: Not on file    Gets together: Not on file    Attends religious service: Not on file    Active member of club or organization: Not on file    Attends meetings of clubs or organizations: Not on file    Relationship status: Not on file  . Intimate partner violence:    Fear of current or ex partner: Not on file    Emotionally abused: Not on file    Physically abused: Not on file    Forced sexual activity: Not on file  Other Topics Concern  . Not on file  Social History Narrative  . Not on file    Review of Systems  PHYSICAL EXAMINATION:    LMP  (LMP Unknown)     General appearance: alert, cooperative and appears stated age Head: Normocephalic, without obvious abnormality, atraumatic Neck: no adenopathy, supple, symmetrical, trachea midline and thyroid normal to inspection and palpation Lungs: clear to auscultation bilaterally Breasts: normal appearance, no masses or tenderness, No nipple retraction or dimpling, No nipple discharge or bleeding, No axillary or supraclavicular adenopathy Heart: regular rate  and rhythm Abdomen: soft, non-tender, no masses,  no organomegaly Extremities: extremities normal, atraumatic, no cyanosis or edema Skin: Skin color, texture, turgor normal. No rashes or lesions Lymph nodes: Cervical, supraclavicular, and axillary nodes normal. No abnormal inguinal nodes palpated Neurologic: Grossly normal  Pelvic: External genitalia:  no lesions              Urethra:  normal appearing urethra with no masses, tenderness or lesions              Bartholins and Skenes: normal                 Vagina: normal appearing vagina with normal color and discharge, no lesions              Cervix: no lesions                Bimanual Exam:  Uterus:  normal size, contour, position, consistency, mobility, non-tender              Adnexa: no mass, fullness, tenderness              Rectal exam: {yes no:[id number]}.  Confirms.              Anus:  normal sphincter tone, no lesions  Chaperone was present for exam.  ASSESSMENT     PLAN     An After Visit Summary was printed and given to the patient.  ______ minutes face to face time of which over 50% was spent in counseling.

## 2018-10-15 ENCOUNTER — Other Ambulatory Visit: Payer: Self-pay

## 2018-10-15 ENCOUNTER — Encounter: Payer: Self-pay | Admitting: Obstetrics and Gynecology

## 2018-10-15 ENCOUNTER — Ambulatory Visit: Payer: 59 | Admitting: Obstetrics and Gynecology

## 2018-10-15 ENCOUNTER — Ambulatory Visit (INDEPENDENT_AMBULATORY_CARE_PROVIDER_SITE_OTHER): Payer: 59 | Admitting: Obstetrics and Gynecology

## 2018-10-15 VITALS — BP 110/66 | HR 70 | Ht 64.0 in | Wt 200.0 lb

## 2018-10-15 DIAGNOSIS — Z9889 Other specified postprocedural states: Secondary | ICD-10-CM

## 2018-10-15 NOTE — Progress Notes (Signed)
GYNECOLOGY  VISIT   HPI: 52 y.o.   Married  Caucasian  female   G2P2 with No LMP recorded (lmp unknown). (Menstrual status: Oral contraceptives).   here for 1 week follow up ANTERIOR REPAIR (CYSTOCELE) (N/A Vagina ) TRANSVAGINAL TAPE (TVT) PROCEDURE exact mid-urethral sling (N/A Vagina ) CYSTOSCOPY (N/A Urethra).   Voiding well and going more slowly.   Has had some coughing and is not leaking.  Little constipation.  Using a stool softener.   Took Motrin this am.  Working well.   A little bleeding.     GYNECOLOGIC HISTORY: No LMP recorded (lmp unknown). (Menstrual status: Oral contraceptives). Contraception: Micronor Menopausal hormone therapy:  n/a Last mammogram:  04/12/17 Bilateral MMG/Left Breast US - BIRADS 2 benign/density b Last pap smear:   06/2018 normal per patient with [facility]         OB History    Gravida  2   Para  2   Term      Preterm      AB      Living  2     SAB      TAB      Ectopic      Multiple      Live Births                 Patient Active Problem List   Diagnosis Date Noted  . Hx of adenomatous polyp of colon 10/10/2017  . Well woman exam 02/07/2017  . Headache 12/28/2015  . Depression 02/28/2014  . Lupus ([place]) 02/28/2014  . GERD (gastroesophageal reflux disease) 02/28/2014  . Insomnia 02/28/2014  . Obesity 02/28/2014    Past Medical History:  Diagnosis Date  . Allergy   . Anxiety    no meds  . Depression    no meds  . Frequent headaches   . GERD (gastroesophageal reflux disease)    diet controlled, no meds   . Hx of adenomatous polyp of colon 10/10/2017  . Lupus ([place])    skin lupus  . SVD (spontaneous vaginal delivery)    x 2  . Urinary incontinence     Past Surgical History:  Procedure Laterality Date  . ANKLE SURGERY Left   . BLADDER SUSPENSION N/A 10/09/2018   Procedure: TRANSVAGINAL TAPE (TVT) PROCEDURE exact mid-urethral sling;  Surgeon: Nunzio Cobbs, MD;  Location: [facility];  Service:  Gynecology;  Laterality: N/A;  . CHOLECYSTECTOMY    . COLONOSCOPY    . CYSTOCELE REPAIR N/A 10/09/2018   Procedure: ANTERIOR REPAIR (CYSTOCELE);  Surgeon: Nunzio Cobbs, MD;  Location: [facility];  Service: Gynecology;  Laterality: N/A;  . CYSTOSCOPY N/A 10/09/2018   Procedure: CYSTOSCOPY;  Surgeon: Nunzio Cobbs, MD;  Location: [facility];  Service: Gynecology;  Laterality: N/A;  . EYE SURGERY Bilateral    Lasik  . FOOT SURGERY Left   . MOUTH SURGERY     deep clean at gum line  . WISDOM TOOTH EXTRACTION      Current Outpatient Medications  Medication Sig Dispense Refill  . ibuprofen (ADVIL,MOTRIN) 800 MG tablet Take 1 tablet (800 mg total) by mouth every 8 (eight) hours as needed. 30 tablet 0  . Melatonin 10 MG TABS Take 10 mg by mouth at bedtime as needed (sleep).     . norethindrone (MICRONOR,CAMILA,ERRIN) 0.35 MG tablet Take 1 tablet by mouth every evening.     Marland Kitchen oxyCODONE-acetaminophen (PERCOCET) 5-325 MG tablet Take 1 tablet  by mouth every 4 (four) hours as needed for severe pain. 20 tablet 0  . Polyvinyl Alcohol-Povidone (REFRESH OP) Place 1 drop into both eyes daily as needed (dry eyes).    . traZODone (DESYREL) 100 MG tablet TAKE 1 AND 1/2 TABLETS BY MOUTH AT BEDTIME AS NEEDED FOR SLEEP. NEEDS APPT FOR REFILLS (Patient taking differently: Take 150 mg by mouth at bedtime as needed for sleep. ) 135 tablet 0   No current facility-administered medications for this visit.      ALLERGIES: Patient has no known allergies.  Family History  Problem Relation Age of Onset  . Hypertension Mother   . Hyperlipidemia Father   . Heart disease Father   . Hypertension Father   . Diabetes Paternal Grandmother   . Breast cancer Maternal Aunt        age 50's or later  . Colon cancer Maternal Aunt   . Breast cancer Maternal Aunt        age 88's or later  . Breast cancer Maternal Aunt        age 42's or late  . Thyroid disease Sister   . Stroke Maternal Grandmother   .  Colon polyps Neg Hx   . Rectal cancer Neg Hx   . Stomach cancer Neg Hx     Social History   Socioeconomic History  . Marital status: Married    Spouse name: Not on file  . Number of children: Not on file  . Years of education: Not on file  . Highest education level: Not on file  Occupational History  . Not on file  Social Needs  . Financial resource strain: Not on file  . Food insecurity:    Worry: Not on file    Inability: Not on file  . Transportation needs:    Medical: Not on file    Non-medical: Not on file  Tobacco Use  . Smoking status: Current Some Day Smoker    Packs/day: 0.25    Types: Cigarettes  . Smokeless tobacco: Never Used  . Tobacco comment: smokes some occasionally   Substance and Sexual Activity  . Alcohol use: Yes    Alcohol/week: 7.0 standard drinks    Types: 7 Glasses of wine per week  . Drug use: No  . Sexual activity: Yes    Birth control/protection: Pill  Lifestyle  . Physical activity:    Days per week: Not on file    Minutes per session: Not on file  . Stress: Not on file  Relationships  . Social connections:    Talks on phone: Not on file    Gets together: Not on file    Attends religious service: Not on file    Active member of club or organization: Not on file    Attends meetings of clubs or organizations: Not on file    Relationship status: Not on file  . Intimate partner violence:    Fear of current or ex partner: Not on file    Emotionally abused: Not on file    Physically abused: Not on file    Forced sexual activity: Not on file  Other Topics Concern  . Not on file  Social History Narrative  . Not on file    Review of Systems  Gastrointestinal: Positive for constipation.  All other systems reviewed and are negative.   PHYSICAL EXAMINATION:    BP 110/66 (BP Location: Right Arm, Patient Position: Sitting, Cuff Size: Large)   Pulse 70  Ht 5\' 4"  (1.626 m)   Wt 200 lb (90.7 kg)   LMP  (LMP Unknown)   BMI 34.33 kg/m      General appearance: alert, cooperative and appears stated age   Pelvic: External genitalia:  Sp incisions intact and with ecchymoses but no induration.             Urethra:  normal appearing urethra with no masses, tenderness or lesions              Bartholins and Skenes: normal                 Vagina: normal appearing vagina with normal color and discharge, no lesions.  Suture line intact to palpation.  Sling protected.               Cervix: no lesions                Bimanual Exam:  Uterus:  normal size, contour, position, consistency, mobility, non-tender              Adnexa: no mass, fullness, tenderness              Chaperone was present for exam.  ASSESSMENT  Status post midurethral sling, anterior colporrhaphy, cystoscopy.  Doing well post op.   PLAN  Surgical findings and procedure reviewed.  I recommend decreased activity for 8 weeks post op.  Follow up for 6 week post op visit.   An After Visit Summary was printed and given to the patient.

## 2018-11-21 ENCOUNTER — Encounter: Payer: Self-pay | Admitting: Obstetrics and Gynecology

## 2018-11-21 ENCOUNTER — Ambulatory Visit (INDEPENDENT_AMBULATORY_CARE_PROVIDER_SITE_OTHER): Payer: 59 | Admitting: Obstetrics and Gynecology

## 2018-11-21 VITALS — BP 122/64 | HR 66 | Resp 14 | Ht 64.0 in | Wt 199.0 lb

## 2018-11-21 DIAGNOSIS — Z9889 Other specified postprocedural states: Secondary | ICD-10-CM

## 2018-11-21 NOTE — Progress Notes (Signed)
GYNECOLOGY  VISIT   HPI: 53 y.o.   Married  Caucasian  female   G2P2 with No LMP recorded. (Menstrual status: Oral contraceptives).   here for   6 week post opt for anterior repair.   Status post TVT Exact midurethral sling, anterior colporrhaphy, cysto on 10/09/18.   No leakage with coughing with recent viral illness.   [place] off the steps at work, 2 - 3 steps.  Some bruising.  No bleeding.  No urinary leakage following this.   Planning a cruise this year.   GYNECOLOGIC HISTORY: No LMP recorded. (Menstrual status: Oral contraceptives). Contraception:  OCP Menopausal hormone therapy:  none Last mammogram: 04/12/17 Bilateral MMG/Left Breast US - BIRADS 2 benign/density b Last pap smear:   06/2018 normal per patient with [facility]         OB History    Gravida  2   Para  2   Term      Preterm      AB      Living  2     SAB      TAB      Ectopic      Multiple      Live Births                 Patient Active Problem List   Diagnosis Date Noted  . Hx of adenomatous polyp of colon 10/10/2017  . Well woman exam 02/07/2017  . Headache 12/28/2015  . Depression 02/28/2014  . Lupus ([place]) 02/28/2014  . GERD (gastroesophageal reflux disease) 02/28/2014  . Insomnia 02/28/2014  . Obesity 02/28/2014    Past Medical History:  Diagnosis Date  . Allergy   . Anxiety    no meds  . Depression    no meds  . Frequent headaches   . GERD (gastroesophageal reflux disease)    diet controlled, no meds   . Hx of adenomatous polyp of colon 10/10/2017  . Lupus ([place])    skin lupus  . SVD (spontaneous vaginal delivery)    x 2  . Urinary incontinence     Past Surgical History:  Procedure Laterality Date  . ANKLE SURGERY Left   . BLADDER SUSPENSION N/A 10/09/2018   Procedure: TRANSVAGINAL TAPE (TVT) PROCEDURE exact mid-urethral sling;  Surgeon: Nunzio Cobbs, MD;  Location: [facility];  Service: Gynecology;  Laterality: N/A;  . CHOLECYSTECTOMY    .  COLONOSCOPY    . CYSTOCELE REPAIR N/A 10/09/2018   Procedure: ANTERIOR REPAIR (CYSTOCELE);  Surgeon: Nunzio Cobbs, MD;  Location: [facility];  Service: Gynecology;  Laterality: N/A;  . CYSTOSCOPY N/A 10/09/2018   Procedure: CYSTOSCOPY;  Surgeon: Nunzio Cobbs, MD;  Location: [facility];  Service: Gynecology;  Laterality: N/A;  . EYE SURGERY Bilateral    Lasik  . FOOT SURGERY Left   . MOUTH SURGERY     deep clean at gum line  . WISDOM TOOTH EXTRACTION      Current Outpatient Medications  Medication Sig Dispense Refill  . ibuprofen (ADVIL,MOTRIN) 800 MG tablet Take 1 tablet (800 mg total) by mouth every 8 (eight) hours as needed. 30 tablet 0  . Melatonin 10 MG TABS Take 10 mg by mouth at bedtime as needed (sleep).     . norethindrone (MICRONOR,CAMILA,ERRIN) 0.35 MG tablet Take 1 tablet by mouth every evening.     Marland Kitchen oxyCODONE-acetaminophen (PERCOCET) 5-325 MG tablet Take 1 tablet by mouth every 4 (four) hours  as needed for severe pain. 20 tablet 0  . Polyvinyl Alcohol-Povidone (REFRESH OP) Place 1 drop into both eyes daily as needed (dry eyes).    . traZODone (DESYREL) 100 MG tablet TAKE 1 AND 1/2 TABLETS BY MOUTH AT BEDTIME AS NEEDED FOR SLEEP. NEEDS APPT FOR REFILLS (Patient taking differently: Take 150 mg by mouth at bedtime as needed for sleep. ) 135 tablet 0   No current facility-administered medications for this visit.      ALLERGIES: Patient has no known allergies.  Family History  Problem Relation Age of Onset  . Hypertension Mother   . Hyperlipidemia Father   . Heart disease Father   . Hypertension Father   . Diabetes Paternal Grandmother   . Breast cancer Maternal Aunt        age 37's or later  . Colon cancer Maternal Aunt   . Breast cancer Maternal Aunt        age 38's or later  . Breast cancer Maternal Aunt        age 30's or late  . Thyroid disease Sister   . Stroke Maternal Grandmother   . Colon polyps Neg Hx   . Rectal cancer Neg Hx   .  Stomach cancer Neg Hx     Social History   Socioeconomic History  . Marital status: Married    Spouse name: Not on file  . Number of children: Not on file  . Years of education: Not on file  . Highest education level: Not on file  Occupational History  . Not on file  Social Needs  . Financial resource strain: Not on file  . Food insecurity:    Worry: Not on file    Inability: Not on file  . Transportation needs:    Medical: Not on file    Non-medical: Not on file  Tobacco Use  . Smoking status: Current Some Day Smoker    Packs/day: 0.25    Types: Cigarettes  . Smokeless tobacco: Never Used  . Tobacco comment: smokes some occasionally   Substance and Sexual Activity  . Alcohol use: Yes    Alcohol/week: 7.0 standard drinks    Types: 7 Glasses of wine per week  . Drug use: No  . Sexual activity: Yes    Birth control/protection: Pill  Lifestyle  . Physical activity:    Days per week: Not on file    Minutes per session: Not on file  . Stress: Not on file  Relationships  . Social connections:    Talks on phone: Not on file    Gets together: Not on file    Attends religious service: Not on file    Active member of club or organization: Not on file    Attends meetings of clubs or organizations: Not on file    Relationship status: Not on file  . Intimate partner violence:    Fear of current or ex partner: Not on file    Emotionally abused: Not on file    Physically abused: Not on file    Forced sexual activity: Not on file  Other Topics Concern  . Not on file  Social History Narrative  . Not on file    Review of Systems  All other systems reviewed and are negative.   PHYSICAL EXAMINATION:    BP 122/64   Pulse 66   Resp 14   Ht 5\' 4"  (1.626 m)   Wt 199 lb (90.3 kg)   BMI 34.16 kg/m  General appearance: alert, cooperative and appears stated age Head: Normocephalic, without obvious abnormality, atraumatic    Pelvic: External genitalia:  Sp incisions  intact.               Urethra:  normal appearing urethra with no masses, tenderness or lesions              Bartholins and Skenes: normal                 Vagina: normal appearing vagina with normal color and discharge, no lesions.  Sling protected and no suture noted.               Cervix: no lesions                Bimanual Exam:  Uterus:  normal size, contour, position, consistency, mobility, non-tender              Adnexa: no mass, fullness, tenderness         Chaperone was present for exam.  ASSESSMENT  Status post TVT Exact midurethral sling, anterior colporrhaphy, cysto on 10/09/18.  Doing well post op.   PLAN  Return to normal activities in 2 weeks.  Resume normal well woman care with Dr. Harrington Challenger.  FU prn.    An After Visit Summary was printed and given to the patient.

## 2019-03-07 ENCOUNTER — Telehealth: Payer: Self-pay | Admitting: Family Medicine

## 2019-03-07 NOTE — Telephone Encounter (Signed)
I called and spoke to patient, Dr. Deborra Medina is still PCP and pt has no current needs at this time to schedule appointment. Patient informed we are here if she needs anything and we are doing virtual visit.

## 2019-04-22 ENCOUNTER — Encounter: Payer: Self-pay | Admitting: Family Medicine

## 2019-04-25 ENCOUNTER — Telehealth: Payer: Self-pay

## 2019-04-25 ENCOUNTER — Ambulatory Visit (INDEPENDENT_AMBULATORY_CARE_PROVIDER_SITE_OTHER): Payer: 59 | Admitting: Family Medicine

## 2019-04-25 ENCOUNTER — Encounter: Payer: Self-pay | Admitting: Family Medicine

## 2019-04-25 ENCOUNTER — Other Ambulatory Visit: Payer: Self-pay

## 2019-04-25 VITALS — BP 122/84 | HR 74 | Temp 98.9°F | Ht 64.0 in | Wt 202.0 lb

## 2019-04-25 DIAGNOSIS — M7502 Adhesive capsulitis of left shoulder: Secondary | ICD-10-CM | POA: Diagnosis not present

## 2019-04-25 MED ORDER — METHYLPREDNISOLONE ACETATE 40 MG/ML IJ SUSP
80.0000 mg | Freq: Once | INTRAMUSCULAR | Status: AC
  Administered 2019-04-25: 80 mg via INTRA_ARTICULAR

## 2019-04-25 MED ORDER — CYCLOBENZAPRINE HCL 10 MG PO TABS: 5.0000 mg | ORAL_TABLET | Freq: Every evening | ORAL | 1 refills | Status: AC | PRN

## 2019-04-25 NOTE — Progress Notes (Signed)
Hamdi Kley T. Bryan Omura, MD Primary Care and [place] at [facility] [place], [address] Phone: [phone]   FAX: [place] - 53 y.o. female   MRN [id number]   Date of Birth: April 06, 1966  Visit Date: 04/25/2019   PCP: Lucille Passy, MD   Referred by: Lucille Passy, MD  Chief Complaint  Patient presents with   Shoulder Pain    Left   Subjective:   Jane Valdez is a 53 y.o. very pleasant female patient who presents with the following: shoulder pain  The patient noted above presents with shoulder pain that has been ongoing for 1 month. there is no history of trauma or accident. The patient denies neck pain or radicular symptoms. No shoulder blade pain Denies dislocation, subluxation, separation of the shoulder. The patient does complain of pain with flexion, abduction, and terminal motion.  Significant restriction of motion. she describes a deep ache around the shoulder, and sometimes it will wake the patient up at night.  Medications Tried: tylenol and nsaids Ice or Heat: minimal help Tried PT: No  Prior shoulder Injury: No Prior surgery: No Prior fracture: No  Past Medical History, Surgical History, Social History, Family History, Medications, and allergies reviewed and updated if relevant.   Patient Active Problem List   Diagnosis Date Noted   Hx of adenomatous polyp of colon 10/10/2017   Well woman exam 02/07/2017   Headache 12/28/2015   Depression 02/28/2014   Lupus ([place]) 02/28/2014   GERD (gastroesophageal reflux disease) 02/28/2014   Insomnia 02/28/2014   Obesity 02/28/2014    Past Medical History:  Diagnosis Date   Allergy    Anxiety    no meds   Depression    no meds   Frequent headaches    GERD (gastroesophageal reflux disease)    diet controlled, no meds    Hx of adenomatous polyp of colon 10/10/2017   Lupus ([place])    skin lupus   SVD (spontaneous vaginal  delivery)    x 2   Urinary incontinence     Past Surgical History:  Procedure Laterality Date   ANKLE SURGERY Left    BLADDER SUSPENSION N/A 10/09/2018   Procedure: TRANSVAGINAL TAPE (TVT) PROCEDURE exact mid-urethral sling;  Surgeon: Nunzio Cobbs, MD;  Location: [facility];  Service: Gynecology;  Laterality: N/A;   CHOLECYSTECTOMY     COLONOSCOPY     CYSTOCELE REPAIR N/A 10/09/2018   Procedure: ANTERIOR REPAIR (CYSTOCELE);  Surgeon: Nunzio Cobbs, MD;  Location: [facility];  Service: Gynecology;  Laterality: N/A;   CYSTOSCOPY N/A 10/09/2018   Procedure: CYSTOSCOPY;  Surgeon: Nunzio Cobbs, MD;  Location: [facility];  Service: Gynecology;  Laterality: N/A;   EYE SURGERY Bilateral    Lasik   FOOT SURGERY Left    MOUTH SURGERY     deep clean at gum line   WISDOM TOOTH EXTRACTION      Social History   Socioeconomic History   Marital status: Married    Spouse name: Not on file   Number of children: Not on file   Years of education: Not on file   Highest education level: Not on file  Occupational History   Not on file  Social Needs   Financial resource strain: Not on file   Food insecurity    Worry: Not on file    Inability: Not on file  Transportation needs    Medical: Not on file    Non-medical: Not on file  Tobacco Use   Smoking status: Current Some Day Smoker    Packs/day: 0.25    Types: Cigarettes   Smokeless tobacco: Never Used   Tobacco comment: smokes some occasionally   Substance and Sexual Activity   Alcohol use: Yes    Alcohol/week: 7.0 standard drinks    Types: 7 Glasses of wine per week   Drug use: No   Sexual activity: Yes    Birth control/protection: Pill  Lifestyle   Physical activity    Days per week: Not on file    Minutes per session: Not on file   Stress: Not on file  Relationships   Social connections    Talks on phone: Not on file    Gets together: Not on file    Attends religious  service: Not on file    Active member of club or organization: Not on file    Attends meetings of clubs or organizations: Not on file    Relationship status: Not on file   Intimate partner violence    Fear of current or ex partner: Not on file    Emotionally abused: Not on file    Physically abused: Not on file    Forced sexual activity: Not on file  Other Topics Concern   Not on file  Social History Narrative   Not on file    Family History  Problem Relation Age of Onset   Hypertension Mother    Hyperlipidemia Father    Heart disease Father    Hypertension Father    Diabetes Paternal Grandmother    Breast cancer Maternal Aunt        age 46's or later   Colon cancer Maternal Aunt    Breast cancer Maternal Aunt        age 49's or later   Breast cancer Maternal Aunt        age 67's or late   Thyroid disease Sister    Stroke Maternal Grandmother    Colon polyps Neg Hx    Rectal cancer Neg Hx    Stomach cancer Neg Hx     No Known Allergies  Medication list reviewed and updated in full in [place].  GEN: No fevers, chills. Nontoxic. Primarily MSK c/o today. MSK: Detailed in the HPI GI: tolerating PO intake without difficulty Neuro: No numbness, parasthesias, or tingling associated. Otherwise the pertinent positives of the ROS are noted above.    Objective:   Blood pressure 122/84, pulse 74, temperature 98.9 F (37.2 C), temperature source Temporal, height 5\' 4"  (1.626 m), weight 202 lb (91.6 kg), SpO2 99 %.   GEN: WDWN, NAD, Non-toxic, Alert & Oriented x 3 HEENT: Atraumatic, Normocephalic.  Ears and Nose: No external deformity. EXTR: No clubbing/cyanosis/edema NEURO: Normal gait.  PSYCH: Normally interactive. Conversant. Not depressed or anxious appearing.  Calm demeanor.   Shoulder: R and L Inspection: No muscle wasting or winging Ecchymosis/edema: neg  AC joint, scapula, clavicle: NT Cervical spine: NT, full ROM Spurling's:  neg ABNORMAL SIDE TESTED: L UNLESS OTHERWISE NOTED, THE CONTRALATERAL SIDE HAS FULL RANGE OF MOTION. Abduction: 5/5, LIMITED TO 105 DEGREES Flexion: 5/5, LIMITED TO 115 DEGNO ROM  IR, lift-off: 5/5. TESTED AT 90 DEGREES OF ABDUCTION, LIMITED TO 0 DEGREES ER at neutral:  5/5, TESTED AT 90 DEGREES OF ABDUCTION, LIMITED TO 85 DEGREES, but the contralateral side goes to 130 AC crossover  and compression: PAIN Supraspinatus insertion: NT Bicipital groove: NT ALL OTHER SPECIAL TESTING EQUIVOCAL GIVEN LOSS OF MOTION C5-T1 intact Sensation intact Grip 5/5   Assessment and Plan:     ICD-10-CM   1. Adhesive capsulitis of left shoulder  M75.02 methylPREDNISolone acetate (DEPO-MEDROL) injection 80 mg   Patient was given a systematic ROM protocol from [employer] to be done daily. Emphasized importance of adherence daily HEP. In the freezing phase, we will hold on formal PT.  The average length of total symptoms is 12-18 months going through 3 different phases in the freezing and thawing process. Reviewed all with patient.   Tylenol or NSAID of choice prn for pain relief Intraarticular shoulder injections discussed with patient, which have good evidence for accelerating the thawing phase.  Patient will be sent for formal PT for aggressive frozen shoulder ROM later. Will need RTC str and scapular stabilization to fix underlying mechanics.  I appreciate the opportunity to evaluate this very friendly patient. If you have any question regarding her care or prognosis, do not hesitate to ask.  Intraarticular Shoulder Aspiration/Injection Procedure Note BLESS LISENBY 1966-01-13 Date of procedure: 04/25/2019  Procedure: Large Joint Aspiration / Injection of Shoulder, Intraarticular, LEFT Indications: Pain  Procedure Details Verbal consent was obtained from the patient. Risks including infection explained and contrasted with benefits and alternatives. Patient prepped with Chloraprep and Ethyl Chloride  used for anesthesia. An intraarticular shoulder injection was performed using the posterior approach; needle placed into joint capsule without difficulty. The patient tolerated the procedure well and had decreased pain post injection. No complications. Injection: 8 cc of Lidocaine 1% and 2 mL Depo-Medrol 40 mg. Needle: 21 gauge, 2 inch   Post injection the patient had decreased pain, slightly increased ROM, but the mechanical block remained in all directions.  Follow-up: Return in about 10 weeks (around 07/04/2019).  Meds ordered this encounter  Medications   cyclobenzaprine (FLEXERIL) 10 MG tablet    Sig: Take 0.5-1 tablets (5-10 mg total) by mouth at bedtime as needed for muscle spasms.    Dispense:  30 tablet    Refill:  1   methylPREDNISolone acetate (DEPO-MEDROL) injection 80 mg   No orders of the defined types were placed in this encounter.   Signed,  Maud Deed. Sandeep Delagarza, MD   Patient's Medications  New Prescriptions   CYCLOBENZAPRINE (FLEXERIL) 10 MG TABLET    Take 0.5-1 tablets (5-10 mg total) by mouth at bedtime as needed for muscle spasms.  Previous Medications   IBUPROFEN (ADVIL,MOTRIN) 800 MG TABLET    Take 1 tablet (800 mg total) by mouth every 8 (eight) hours as needed.   MELATONIN 10 MG TABS    Take 10 mg by mouth at bedtime as needed (sleep).    NORETHINDRONE (MICRONOR,CAMILA,ERRIN) 0.35 MG TABLET    Take 1 tablet by mouth every evening.    TRAZODONE (DESYREL) 100 MG TABLET    TAKE 1 AND 1/2 TABLETS BY MOUTH AT BEDTIME AS NEEDED FOR SLEEP. NEEDS APPT FOR REFILLS  Modified Medications   No medications on file  Discontinued Medications   OXYCODONE-ACETAMINOPHEN (PERCOCET) 5-325 MG TABLET    Take 1 tablet by mouth every 4 (four) hours as needed for severe pain.   POLYVINYL ALCOHOL-POVIDONE (REFRESH OP)    Place 1 drop into both eyes daily as needed (dry eyes).

## 2019-04-25 NOTE — Telephone Encounter (Signed)
Questions for Screening COVID-19  Symptom onset: None  Travel or Contacts: None  During this illness, did/does the patient experience any of the following symptoms? Fever >100.6F []   Yes [x]   No []   Unknown Subjective fever (felt feverish) []   Yes [x]   No []   Unknown Chills []   Yes [x]   No []   Unknown Muscle aches (myalgia) []   Yes [x]   No []   Unknown Runny nose (rhinorrhea) []   Yes [x]   No []   Unknown Sore throat []   Yes [x]   No []   Unknown Cough (new onset or worsening of chronic cough) []   Yes [x]   No []   Unknown Shortness of breath (dyspnea) []   Yes [x]   No []   Unknown Nausea or vomiting []   Yes [x]   No []   Unknown Headache []   Yes [x]   No []   Unknown Abdominal pain  []   Yes [x]   No []   Unknown Diarrhea (?3 loose/looser than normal stools/24hr period) []   Yes [x]   No []   Unknown Other, specify:  Patient risk factors: Smoker? []   Current []   Former []   Never If female, currently pregnant? []   Yes []   No  Patient Active Problem List   Diagnosis Date Noted  . Hx of adenomatous polyp of colon 10/10/2017  . Well woman exam 02/07/2017  . Headache 12/28/2015  . Depression 02/28/2014  . Lupus ([place]) 02/28/2014  . GERD (gastroesophageal reflux disease) 02/28/2014  . Insomnia 02/28/2014  . Obesity 02/28/2014    Plan:  []   High risk for COVID-19 with red flags go to ED (with CP, SOB, weak/lightheaded, or fever > 101.5). Call ahead.  []   High risk for COVID-19 but stable. Inform provider and coordinate time for [facility] visit.   []   No red flags but URI signs or symptoms okay for [facility] visit.

## 2019-07-04 ENCOUNTER — Ambulatory Visit: Payer: 59 | Admitting: Family Medicine

## 2019-07-17 ENCOUNTER — Encounter: Payer: Self-pay | Admitting: Gynecology

## 2019-08-15 ENCOUNTER — Other Ambulatory Visit: Payer: Self-pay | Admitting: Obstetrics and Gynecology

## 2019-08-15 DIAGNOSIS — Z1231 Encounter for screening mammogram for malignant neoplasm of breast: Secondary | ICD-10-CM

## 2019-08-27 LAB — RESULTS CONSOLE HPV: CHL HPV: NEGATIVE

## 2019-08-27 LAB — HM PAP SMEAR: HM Pap smear: NEGATIVE

## 2019-09-09 ENCOUNTER — Telehealth: Payer: Self-pay

## 2019-09-09 DIAGNOSIS — F4311 Post-traumatic stress disorder, acute: Secondary | ICD-10-CM | POA: Insufficient documentation
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
## 2019-09-10 ENCOUNTER — Ambulatory Visit (INDEPENDENT_AMBULATORY_CARE_PROVIDER_SITE_OTHER): Payer: 59 | Admitting: Family Medicine

## 2019-09-10 ENCOUNTER — Other Ambulatory Visit: Payer: Self-pay

## 2019-09-10 VITALS — BP 122/90

## 2019-09-10 DIAGNOSIS — G4700 Insomnia, unspecified: Secondary | ICD-10-CM | POA: Diagnosis not present

## 2019-09-10 DIAGNOSIS — F4311 Post-traumatic stress disorder, acute: Secondary | ICD-10-CM

## 2019-09-10 DIAGNOSIS — M329 Systemic lupus erythematosus, unspecified: Secondary | ICD-10-CM | POA: Diagnosis not present

## 2019-09-10 MED ORDER — TRAZODONE HCL 100 MG PO TABS: ORAL_TABLET | ORAL | 6 refills | Status: DC

## 2019-09-10 NOTE — Assessment & Plan Note (Signed)
>  25 minutes spent in face to face time with patient, >50% spent in counselling or coordination of care discussing acute ptsd, insomnia.  We discussed counseling through her employer- she said they do have a peer support group meeting today.  We discussed other treatment options, including minipress.  We did agree to try trazodone 150 mg qhs (not just as needed) and she will call me in a few weeks with an update, sooner if she has any issues or concerns. The patient indicates understanding of these issues and agrees with the plan.

## 2019-09-10 NOTE — Telephone Encounter (Signed)
Pt had VV this am/thx dmf

## 2019-10-04 ENCOUNTER — Ambulatory Visit
Admission: RE | Admit: 2019-10-04 | Discharge: 2019-10-04 | Disposition: A | Discharge status: Home / Self Care | Payer: 59 | Source: Ambulatory Visit | Attending: Obstetrics and Gynecology | Admitting: Obstetrics and Gynecology

## 2019-10-04 ENCOUNTER — Other Ambulatory Visit: Payer: Self-pay

## 2019-10-04 DIAGNOSIS — Z1231 Encounter for screening mammogram for malignant neoplasm of breast: Secondary | ICD-10-CM

## 2020-01-13 IMAGING — MG DIGITAL SCREENING BILAT W/ TOMO W/ CAD
8 series · 8 of 24 positions shown · non-contrast
Comparison: Previous exam(s).

CLINICAL DATA: Screening.

EXAM:
DIGITAL SCREENING BILATERAL MAMMOGRAM WITH TOMO AND CAD

[R MLO synth-2D]
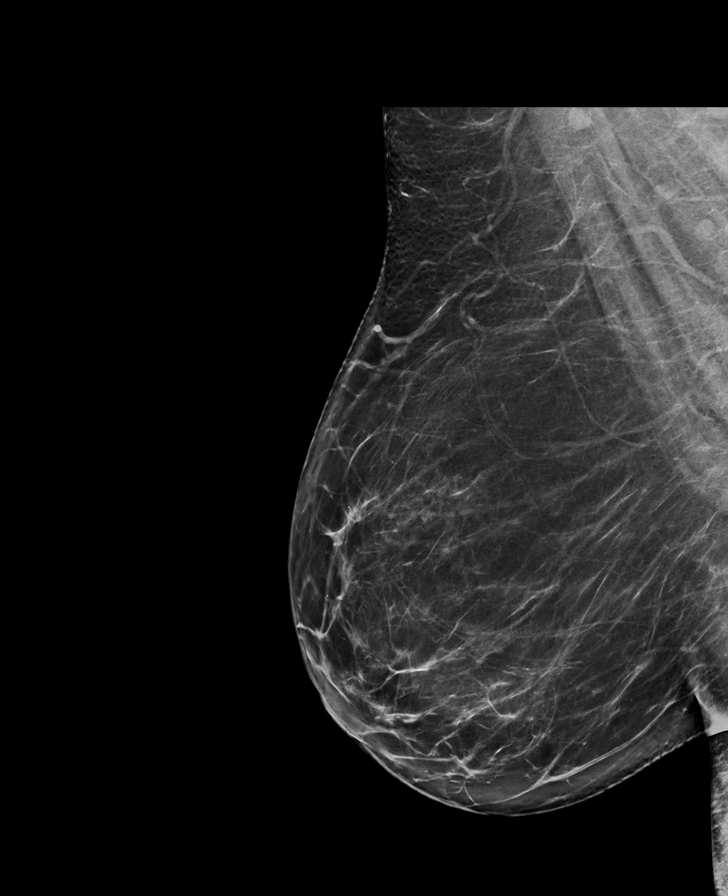

[L CC synth-2D]
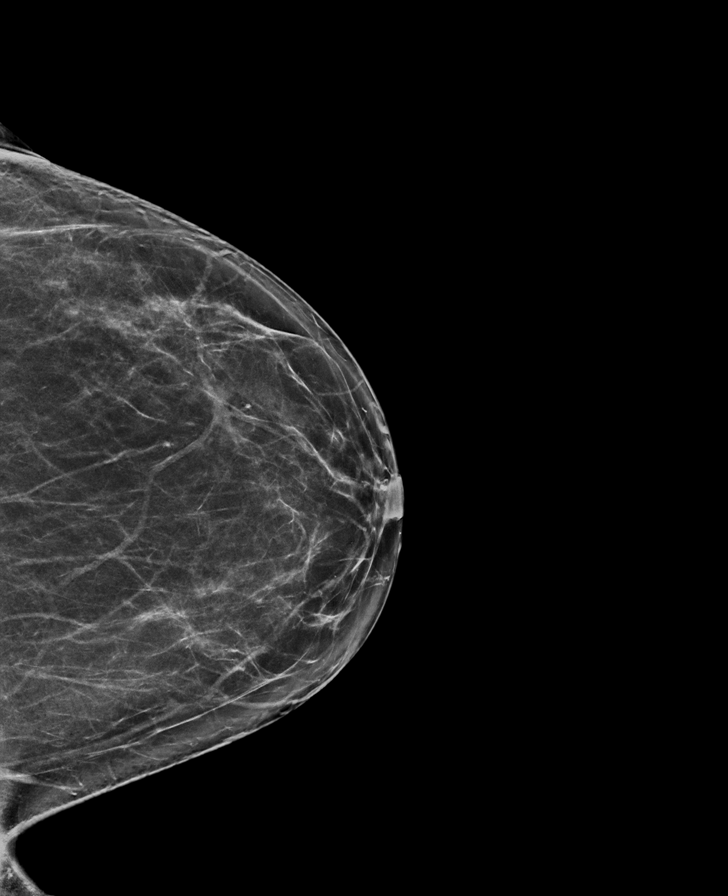

[R CC synth-2D]
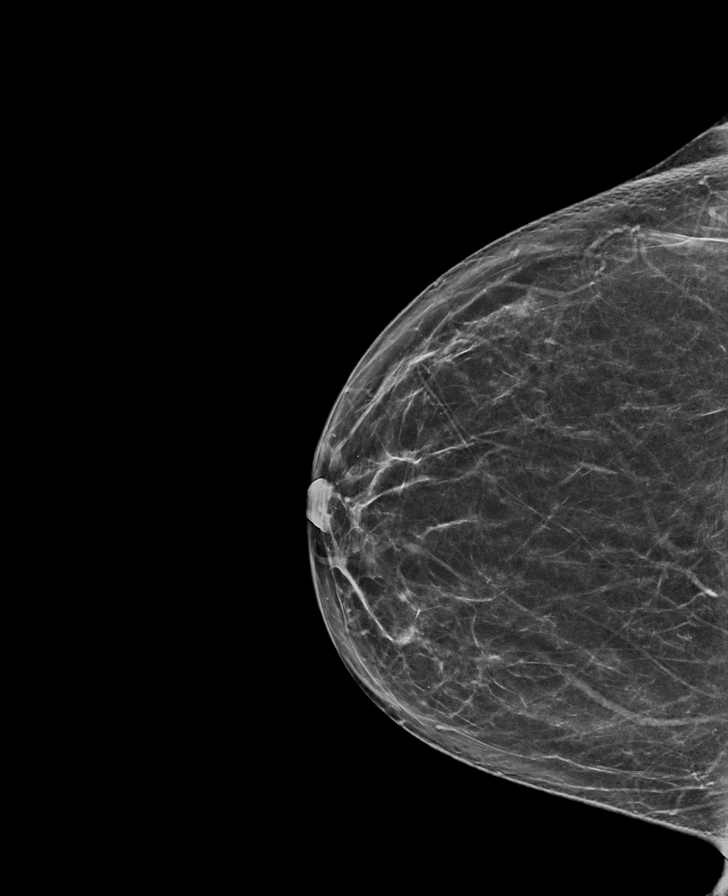

[L MLO synth-2D]
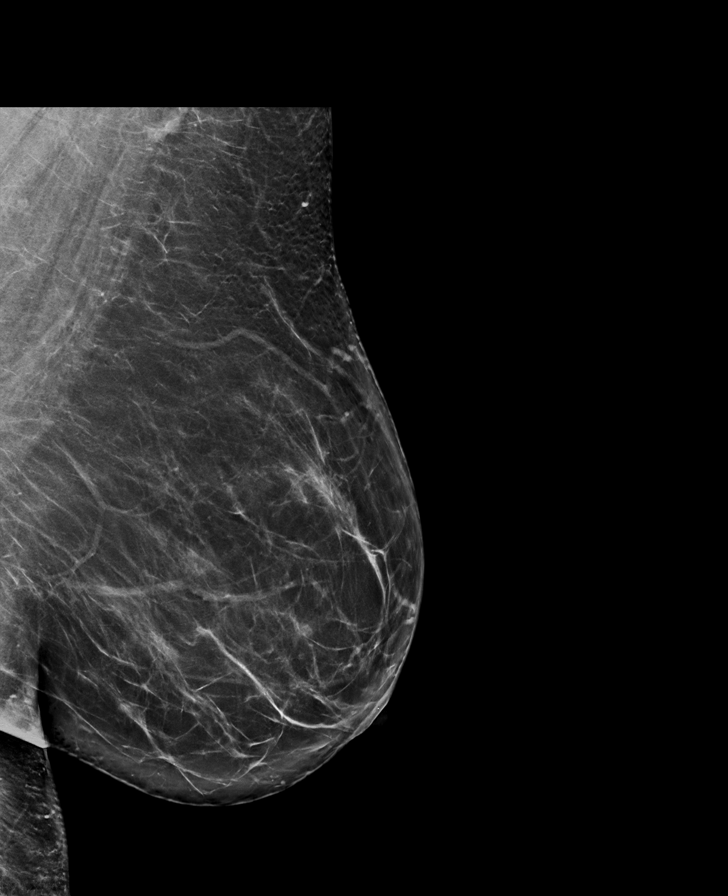

[L CC tomo · tomo slice 37/73.0]
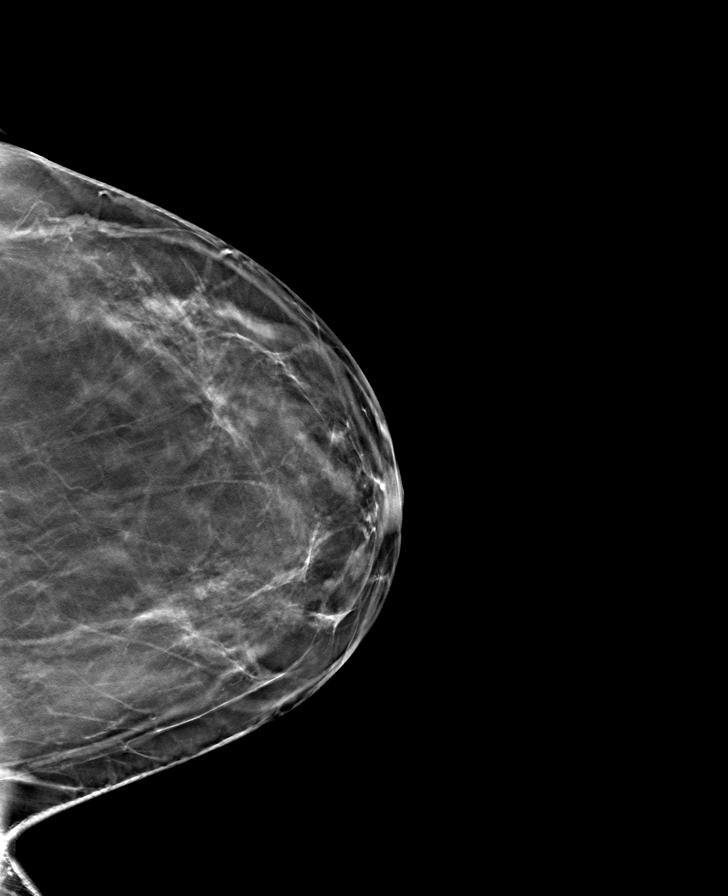

[L MLO tomo · tomo slice 45/88.0]
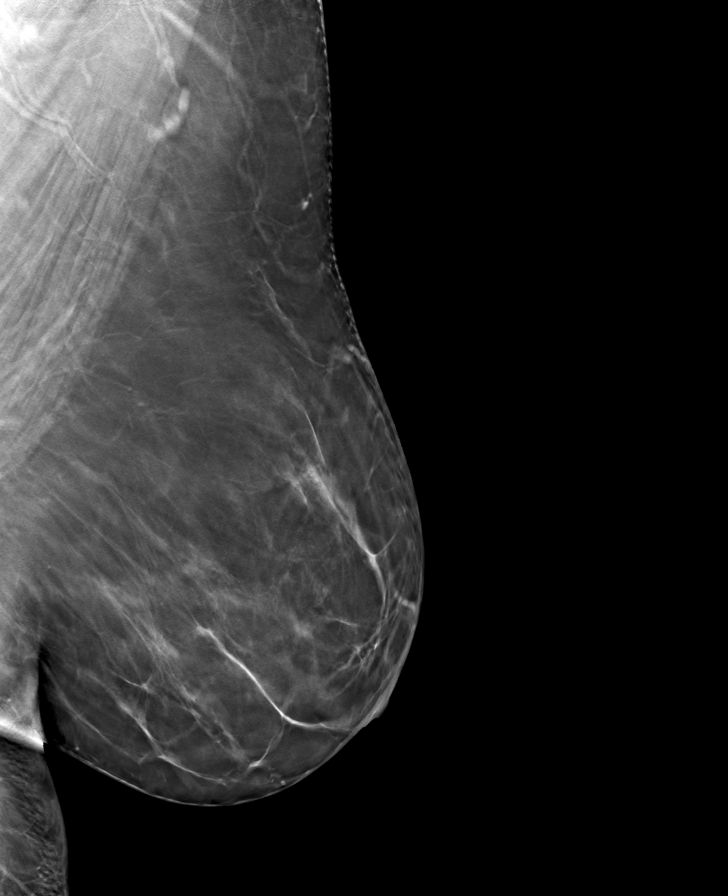

[R MLO tomo · tomo slice 45/89.0]
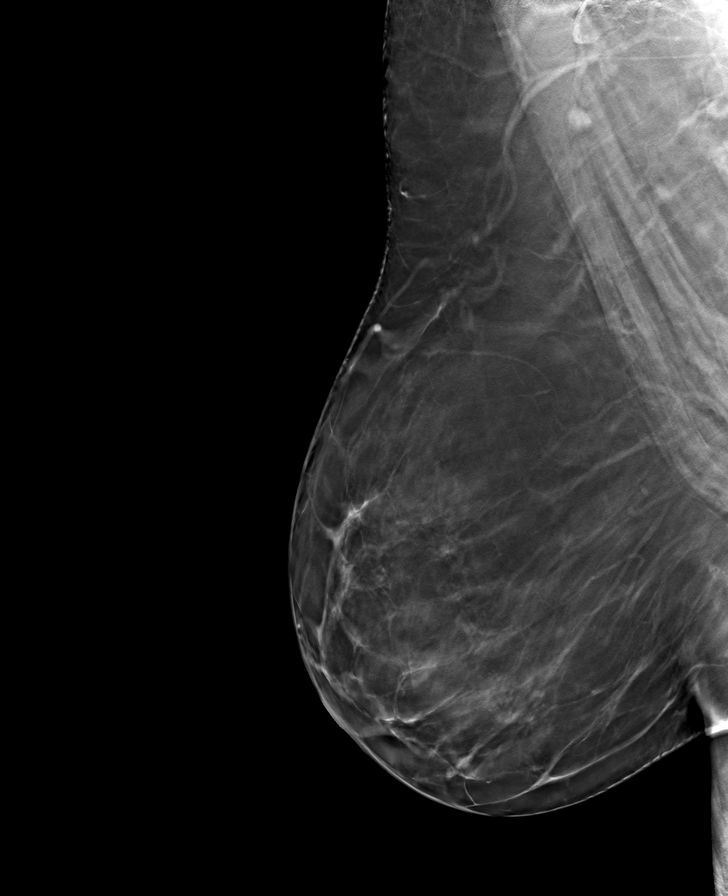

[R CC tomo · tomo slice 38/75.0]
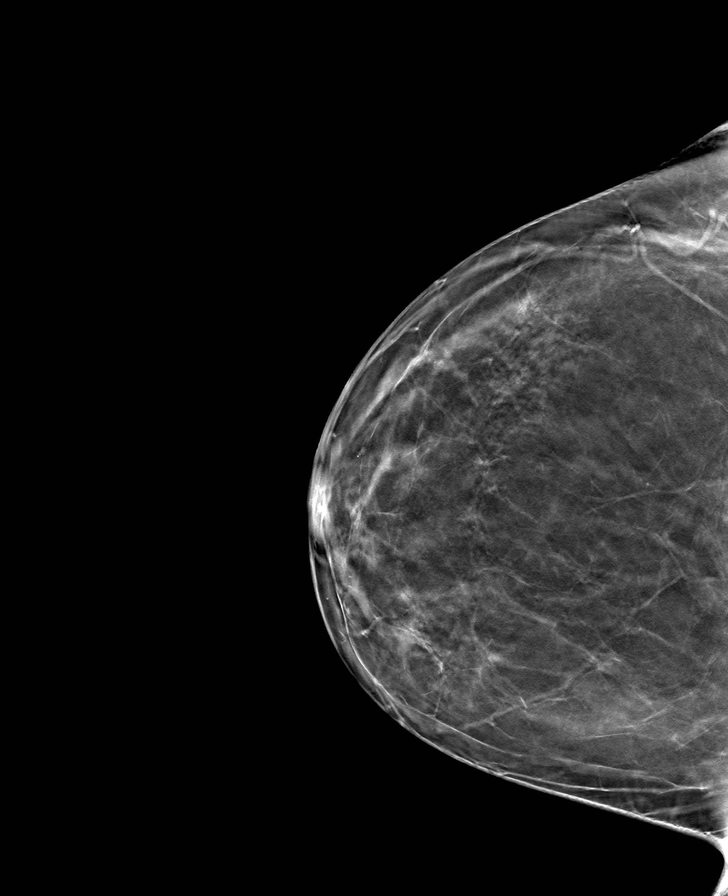

[8 of 24 positions shown; findings below may reference images not displayed]

ACR Breast Density Category b: There are scattered areas of
fibroglandular density.
FINDINGS: There are no findings suspicious for malignancy. Images were
processed with CAD.
IMPRESSION: No mammographic evidence of malignancy. A result letter of this
screening mammogram will be mailed directly to the patient.

RECOMMENDATION:
Screening mammogram in one year. (Code:[id number])

BI-RADS CATEGORY  1: Negative.

## 2020-02-10 ENCOUNTER — Telehealth: Payer: Self-pay | Admitting: Obstetrics and Gynecology

## 2020-02-10 NOTE — Telephone Encounter (Signed)
Patient is having incontinence issues.

## 2020-02-10 NOTE — Telephone Encounter (Signed)
AEX with Dr Harrington Challenger.  Last OV with Dr Quincy Simmonds 10/2018 as post op Had TVT Exact midurethral sling, anterior colporrhaphy, cysto on 10/09/18.  Spoke with pt. Pt reports having more incontinence issues  of not "making" it to the bathroom on time and cant stop urinary  flow that begin one day in Nov 2020.  Pt had previous sling surgery in 09/2018. Pt states wearing incontinence  pad that she changes once a day and wearing an overnight pad as well. Pt denies any UTI sx, vaginal bleeding or discharge. States does do Kegel exercises, but hasn't helped.   Advised pt to be seen by Dr Quincy Simmonds for further evaluation. Pt agreeable. Pt scheduled for OV with Dr Quincy Simmonds on 4/21 at 3:30 pm. Pt verbalized understanding. CPS Neg. Pt will get second Covid vaccine on 4/20 at 9am. Pt knows to call office if isn't feeling well to reschedule OV. Pt agreeable.   Routing to Dr Quincy Simmonds for review.  Encounter closed.

## 2020-02-12 ENCOUNTER — Encounter: Payer: Self-pay | Admitting: Obstetrics and Gynecology

## 2020-02-12 ENCOUNTER — Other Ambulatory Visit: Payer: Self-pay

## 2020-02-12 ENCOUNTER — Ambulatory Visit: Payer: 59 | Admitting: Obstetrics and Gynecology

## 2020-02-12 VITALS — BP 118/70 | HR 84 | Temp 98.1°F | Ht 64.0 in | Wt 206.4 lb

## 2020-02-12 DIAGNOSIS — R32 Unspecified urinary incontinence: Secondary | ICD-10-CM

## 2020-02-12 DIAGNOSIS — N3281 Overactive bladder: Secondary | ICD-10-CM | POA: Diagnosis not present

## 2020-02-12 LAB — POCT URINALYSIS DIPSTICK
Bilirubin, UA: NEGATIVE
Glucose, UA: NEGATIVE
Leukocytes, UA: NEGATIVE
Nitrite, UA: NEGATIVE
Protein, UA: NEGATIVE
Urobilinogen, UA: 0.2 E.U./dL
pH, UA: 5 (ref 5.0–8.0)

## 2020-02-12 NOTE — Progress Notes (Signed)
GYNECOLOGY  VISIT   HPI: 54 y.o.   Married  Caucasian  female   G2P2 with No LMP recorded. (Menstrual status: Oral contraceptives).   here for incontinence. Patient also complains of having an occasional right lower quadrant pain.     States she woke up one morning in November and could not control her urine when she was getting up to void.  She has tried [employer] , which did not help.  She is not able to feel the need to void.   She is not able to stop her urinary stream.   DF - every 2 hours.  NF - at least twice.   Denies pain with urination.  Some right lower quadrant pain which is coming and going and not certain how long it is going on.    Happy with surgical outcome from her TVT and anterior colporrhaphy. Denies urinary leakage with cough or sneeze or laugh.  No UTIs or blood in the urine since surgery.  She denies a vaginal bulge.   Takes Micronor and she does not really have a period other than spotting.   Drinks coffee in the am.  1 caffeine free soda for lunch.  Occasional wine.   Urine: 2+ RBC, +Ketones  GYNECOLOGIC HISTORY: No LMP recorded. (Menstrual status: Oral contraceptives). Contraception:  Micronor Menopausal hormone therapy:  none Last mammogram:  10/04/19 BIRADS 1 negative/density b Last pap smear:   06/2018 normal per patient with [facility]        OB History    Gravida  2   Para  2   Term      Preterm      AB      Living  2     SAB      TAB      Ectopic      Multiple      Live Births                 Patient Active Problem List   Diagnosis Date Noted  . Acute post-traumatic stress disorder 09/09/2019  . Hx of adenomatous polyp of colon 10/10/2017  . Headache 12/28/2015  . Depression 02/28/2014  . Lupus ([place]) 02/28/2014  . GERD (gastroesophageal reflux disease) 02/28/2014  . Insomnia 02/28/2014  . Obesity 02/28/2014    Past Medical History:  Diagnosis Date  . Allergy   . Anxiety    no meds  . Depression    no  meds  . Frequent headaches   . GERD (gastroesophageal reflux disease)    diet controlled, no meds   . Hx of adenomatous polyp of colon 10/10/2017  . Lupus ([place])    skin lupus  . SVD (spontaneous vaginal delivery)    x 2  . Urinary incontinence     Past Surgical History:  Procedure Laterality Date  . ANKLE SURGERY Left   . BLADDER SUSPENSION N/A 10/09/2018   Procedure: TRANSVAGINAL TAPE (TVT) PROCEDURE exact mid-urethral sling;  Surgeon: Nunzio Cobbs, MD;  Location: [facility];  Service: Gynecology;  Laterality: N/A;  . CHOLECYSTECTOMY    . COLONOSCOPY    . CYSTOCELE REPAIR N/A 10/09/2018   Procedure: ANTERIOR REPAIR (CYSTOCELE);  Surgeon: Nunzio Cobbs, MD;  Location: [facility];  Service: Gynecology;  Laterality: N/A;  . CYSTOSCOPY N/A 10/09/2018   Procedure: CYSTOSCOPY;  Surgeon: Nunzio Cobbs, MD;  Location: [facility];  Service: Gynecology;  Laterality: N/A;  . EYE SURGERY Bilateral  Lasik  . FOOT SURGERY Left   . MOUTH SURGERY     deep clean at gum line  . WISDOM TOOTH EXTRACTION      Current Outpatient Medications  Medication Sig Dispense Refill  . cyclobenzaprine (FLEXERIL) 10 MG tablet Take 0.5-1 tablets (5-10 mg total) by mouth at bedtime as needed for muscle spasms. 30 tablet 1  . ibuprofen (ADVIL,MOTRIN) 800 MG tablet Take 1 tablet (800 mg total) by mouth every 8 (eight) hours as needed. 30 tablet 0  . Melatonin 10 MG TABS Take 10 mg by mouth at bedtime as needed (sleep).     . norethindrone (MICRONOR,CAMILA,ERRIN) 0.35 MG tablet Take 1 tablet by mouth every evening.     . traZODone (DESYREL) 100 MG tablet TAKE 1 AND 1/2 TABLETS BY MOUTH AT BEDTIME AS NEEDED FOR SLEEP. NEEDS APPT FOR REFILLS 135 tablet 6   No current facility-administered medications for this visit.     ALLERGIES: Patient has no known allergies.  Family History  Problem Relation Age of Onset  . Hypertension Mother   . Hyperlipidemia Father   . Heart disease Father    . Hypertension Father   . Diabetes Paternal Grandmother   . Breast cancer Maternal Aunt        age 66's or later  . Colon cancer Maternal Aunt   . Breast cancer Maternal Aunt        age 40's or later  . Breast cancer Maternal Aunt        age 44's or late  . Thyroid disease Sister   . Stroke Maternal Grandmother   . Colon polyps Neg Hx   . Rectal cancer Neg Hx   . Stomach cancer Neg Hx     Social History   Socioeconomic History  . Marital status: Married    Spouse name: Not on file  . Number of children: Not on file  . Years of education: Not on file  . Highest education level: Not on file  Occupational History  . Not on file  Tobacco Use  . Smoking status: Current Some Day Smoker    Packs/day: 0.25    Types: Cigarettes  . Smokeless tobacco: Never Used  . Tobacco comment: smokes some occasionally   Substance and Sexual Activity  . Alcohol use: Yes    Alcohol/week: 7.0 standard drinks    Types: 7 Glasses of wine per week  . Drug use: No  . Sexual activity: Yes    Birth control/protection: Pill  Other Topics Concern  . Not on file  Social History Narrative  . Not on file   Social Determinants of Health   Financial Resource Strain:   . Difficulty of Paying Living Expenses:   Food Insecurity:   . Worried About Charity fundraiser in the Last Year:   . Arboriculturist in the Last Year:   Transportation Needs:   . Film/video editor (Medical):   Marland Kitchen Lack of Transportation (Non-Medical):   Physical Activity:   . Days of Exercise per Week:   . Minutes of Exercise per Session:   Stress:   . Feeling of Stress :   Social Connections:   . Frequency of Communication with Friends and Family:   . Frequency of Social Gatherings with Friends and Family:   . Attends Religious Services:   . Active Member of Clubs or Organizations:   . Attends Archivist Meetings:   Marland Kitchen Marital Status:   Intimate Partner Violence:   .  Fear of Current or Ex-Partner:   .  Emotionally Abused:   Marland Kitchen Physically Abused:   . Sexually Abused:     Review of Systems  Constitutional: Negative.   HENT: Negative.   Eyes: Negative.   Respiratory: Negative.   Cardiovascular: Negative.   Gastrointestinal: Negative.   Endocrine: Negative.   Genitourinary:       Urinary incontinence   Musculoskeletal: Negative.   Skin: Negative.   Allergic/Immunologic: Negative.   Neurological: Negative.   Hematological: Negative.   Psychiatric/Behavioral: Negative.     PHYSICAL EXAMINATION:    BP 118/70 (BP Location: Left Arm, Patient Position: Sitting, Cuff Size: Normal)   Pulse 84   Temp 98.1 F (36.7 C) (Temporal)   Ht 5\' 4"  (1.626 m)   Wt 206 lb 6.4 oz (93.6 kg)   BMI 35.43 kg/m     General appearance: alert, cooperative and appears stated age Head: Normocephalic, without obvious abnormality, atraumatic    Pelvic: External genitalia:  no lesions              Urethra:  normal appearing urethra with no masses, tenderness or lesions              Bartholins and Skenes: normal                 Vagina: normal appearing vagina with normal color and discharge, no lesions.  Mucous.  Good support.               Cervix: no lesions                Bimanual Exam:  Uterus:  normal size, contour, position, consistency, mobility, non-tender              Adnexa: no mass, fullness, tenderness               Chaperone was present for exam.  ASSESSMENT  Overactive bladder.  Status post TVT Exact midurethral sling, anterior colporrhaphy, cysto.  Microscopic hematuria.   PLAN  Urine micro and culture.  We discussed overactive bladder.  Bladder irritants reviewed.  Return for a recheck in 10 days for a post void residual and anticipated Rx for OAB.  We reviewed meds and potential side effects.  Pelvic floor PT also discussed.     An After Visit Summary was printed and given to the patient.  __25____ minutes face to face time of which over 50% was spent in counseling.

## 2020-02-12 NOTE — Patient Instructions (Signed)

## 2020-02-13 LAB — URINALYSIS, MICROSCOPIC ONLY
Bacteria, UA: NONE SEEN
Casts: NONE SEEN /lpf

## 2020-02-14 LAB — URINE CULTURE

## 2020-02-17 ENCOUNTER — Telehealth: Payer: Self-pay

## 2020-02-17 NOTE — Telephone Encounter (Signed)
Left message to call [facility] at [phone].   She needs to return for a post void residual and recheck with Dr.Silva on or around 02/26/2020.

## 2020-02-19 NOTE — Telephone Encounter (Signed)
Spoke with patient. Appointment for post void residual scheduled for 02/27/2020 at 2 pm with Dr.Silva. Patient is agreeable to date and time. Aware to arrive to the office with a comfortably full bladder. Will have to empty bladder right before seeing Dr.Silva. Patient verbalizes understanding.  Routing to provider and will close encounter.

## 2020-02-26 ENCOUNTER — Other Ambulatory Visit: Payer: Self-pay

## 2020-02-27 ENCOUNTER — Ambulatory Visit: Payer: 59 | Admitting: Obstetrics and Gynecology

## 2020-02-27 ENCOUNTER — Encounter: Payer: Self-pay | Admitting: Obstetrics and Gynecology

## 2020-02-27 VITALS — BP 116/72 | HR 76 | Temp 98.3°F | Ht 64.0 in | Wt 206.0 lb

## 2020-02-27 DIAGNOSIS — N3281 Overactive bladder: Secondary | ICD-10-CM | POA: Diagnosis not present

## 2020-02-27 MED ORDER — OXYBUTYNIN CHLORIDE ER 10 MG PO TB24: 10.0000 mg | ORAL_TABLET | Freq: Every day | ORAL | 1 refills | Status: DC

## 2020-02-27 NOTE — Progress Notes (Signed)
GYNECOLOGY  VISIT   HPI: 54 y.o.   Married  Caucasian  female   G2P2 with No LMP recorded. (Menstrual status: Oral contraceptives).   here for post void residual.   Voiding every 2 hours during the day and up twice a night to void.  Would like to be able to travel more and not be concerned about her frequent need to void.  She tried a medication for overactive bladder in the past, and does not remember the name.  Thinks it started with an M.  States it did not work well.  Voided 250 cc.   UC from 02/12/20 showed lactobacillus only.   Denies personal hx of glaucoma or cardiac arrhythmia.   GYNECOLOGIC HISTORY: No LMP recorded. (Menstrual status: Oral contraceptives). Contraception:  Micronor Menopausal hormone therapy:  none Last mammogram:  10/04/19 BIRADS 1 negative/density b Last pap smear:   06/2018 normal per patient with [facility]        OB History    Gravida  2   Para  2   Term      Preterm      AB      Living  2     SAB      TAB      Ectopic      Multiple      Live Births                 Patient Active Problem List   Diagnosis Date Noted  . Acute post-traumatic stress disorder 09/09/2019  . Hx of adenomatous polyp of colon 10/10/2017  . Headache 12/28/2015  . Depression 02/28/2014  . Lupus ([place]) 02/28/2014  . GERD (gastroesophageal reflux disease) 02/28/2014  . Insomnia 02/28/2014  . Obesity 02/28/2014    Past Medical History:  Diagnosis Date  . Allergy   . Anxiety    no meds  . Depression    no meds  . Frequent headaches   . GERD (gastroesophageal reflux disease)    diet controlled, no meds   . Hx of adenomatous polyp of colon 10/10/2017  . Lupus ([place])    skin lupus  . SVD (spontaneous vaginal delivery)    x 2  . Urinary incontinence     Past Surgical History:  Procedure Laterality Date  . ANKLE SURGERY Left   . BLADDER SUSPENSION N/A 10/09/2018   Procedure: TRANSVAGINAL TAPE (TVT) PROCEDURE exact mid-urethral sling;   Surgeon: Nunzio Cobbs, MD;  Location: [facility];  Service: Gynecology;  Laterality: N/A;  . CHOLECYSTECTOMY    . COLONOSCOPY    . CYSTOCELE REPAIR N/A 10/09/2018   Procedure: ANTERIOR REPAIR (CYSTOCELE);  Surgeon: Nunzio Cobbs, MD;  Location: [facility];  Service: Gynecology;  Laterality: N/A;  . CYSTOSCOPY N/A 10/09/2018   Procedure: CYSTOSCOPY;  Surgeon: Nunzio Cobbs, MD;  Location: [facility];  Service: Gynecology;  Laterality: N/A;  . EYE SURGERY Bilateral    Lasik  . FOOT SURGERY Left   . MOUTH SURGERY     deep clean at gum line  . WISDOM TOOTH EXTRACTION      Current Outpatient Medications  Medication Sig Dispense Refill  . cyclobenzaprine (FLEXERIL) 10 MG tablet Take 0.5-1 tablets (5-10 mg total) by mouth at bedtime as needed for muscle spasms. 30 tablet 1  . Melatonin 10 MG TABS Take 10 mg by mouth at bedtime as needed (sleep).     . norethindrone (MICRONOR,CAMILA,ERRIN) 0.35 MG tablet Take  1 tablet by mouth every evening.     . traZODone (DESYREL) 100 MG tablet TAKE 1 AND 1/2 TABLETS BY MOUTH AT BEDTIME AS NEEDED FOR SLEEP. NEEDS APPT FOR REFILLS 135 tablet 6   No current facility-administered medications for this visit.     ALLERGIES: Patient has no known allergies.  Family History  Problem Relation Age of Onset  . Hypertension Mother   . Hyperlipidemia Father   . Heart disease Father   . Hypertension Father   . Diabetes Paternal Grandmother   . Breast cancer Maternal Aunt        age 39's or later  . Colon cancer Maternal Aunt   . Breast cancer Maternal Aunt        age 72's or later  . Breast cancer Maternal Aunt        age 91's or late  . Thyroid disease Sister   . Stroke Maternal Grandmother   . Colon polyps Neg Hx   . Rectal cancer Neg Hx   . Stomach cancer Neg Hx     Social History   Socioeconomic History  . Marital status: Married    Spouse name: Not on file  . Number of children: Not on file  . Years of education: Not  on file  . Highest education level: Not on file  Occupational History  . Not on file  Tobacco Use  . Smoking status: Current Some Day Smoker    Packs/day: 0.25    Types: Cigarettes  . Smokeless tobacco: Never Used  . Tobacco comment: smokes some occasionally   Substance and Sexual Activity  . Alcohol use: Yes    Alcohol/week: 7.0 standard drinks    Types: 7 Glasses of wine per week  . Drug use: No  . Sexual activity: Yes    Birth control/protection: Pill  Other Topics Concern  . Not on file  Social History Narrative  . Not on file   Social Determinants of Health   Financial Resource Strain:   . Difficulty of Paying Living Expenses:   Food Insecurity:   . Worried About Charity fundraiser in the Last Year:   . Arboriculturist in the Last Year:   Transportation Needs:   . Film/video editor (Medical):   Marland Kitchen Lack of Transportation (Non-Medical):   Physical Activity:   . Days of Exercise per Week:   . Minutes of Exercise per Session:   Stress:   . Feeling of Stress :   Social Connections:   . Frequency of Communication with Friends and Family:   . Frequency of Social Gatherings with Friends and Family:   . Attends Religious Services:   . Active Member of Clubs or Organizations:   . Attends Archivist Meetings:   Marland Kitchen Marital Status:   Intimate Partner Violence:   . Fear of Current or Ex-Partner:   . Emotionally Abused:   Marland Kitchen Physically Abused:   . Sexually Abused:     Review of Systems  Constitutional: Negative.   HENT: Negative.   Eyes: Negative.   Respiratory: Negative.   Cardiovascular: Negative.   Gastrointestinal: Negative.   Endocrine: Negative.   Genitourinary: Negative.   Musculoskeletal: Negative.   Skin: Negative.   Allergic/Immunologic: Negative.   Neurological: Negative.   Hematological: Negative.   Psychiatric/Behavioral: Negative.     PHYSICAL EXAMINATION:    BP 116/72 (BP Location: Left Arm, Patient Position: Sitting, Cuff Size:  Large)   Pulse 76   Temp  98.3 F (36.8 C) (Temporal)   Ht '5\' 4"'  (1.626 m)   Wt 206 lb (93.4 kg)   BMI 35.36 kg/m     General appearance: alert, cooperative and appears stated age    Pelvic: External genitalia:  no lesions              Urethra:  normal appearing urethra with no masses, tenderness or lesions                        Post void residual check with I and O cath kit. Verbal permission for procedure.  Sterile prep with betadine.  Cath tip place without difficulty. PVR - 35 cc of clear, yellow urine.   Chaperone was present for exam.  ASSESSMENT  Overactive bladder.  Normal voiding.  Status post midurethral sling/anterior colporrhaphy/cysto.  PLAN  We reviewed overactive bladder and the mechanism of action versus stress incontinence.  Will start Ditropan Xl 10 mg daily.  #30, RF one.  Side effects of dry mouth, constipation, and urinary retention discussed. FU in 6 weeks.    An After Visit Summary was printed and given to the patient.  _15_____ minutes face to face time of which over 50% was spent in counseling.

## 2020-02-27 NOTE — Patient Instructions (Signed)
Oxybutynin extended-release tablets What is this medicine? OXYBUTYNIN (ox i BYOO ti nin) is used to treat overactive bladder. This medicine reduces the amount of bathroom visits. It may also help to control wetting accidents. This medicine may be used for other purposes; ask your health care provider or pharmacist if you have questions. COMMON BRAND NAME(S): Ditropan XL What should I tell my health care provider before I take this medicine? They need to know if you have any of these conditions:  autonomic neuropathy  dementia  difficulty passing urine  glaucoma  intestinal obstruction  kidney disease  liver disease  myasthenia gravis  Parkinson's disease  an unusual or allergic reaction to oxybutynin, other medicines, foods, dyes, or preservatives  pregnant or trying to get pregnant  breast-feeding How should I use this medicine? Take this medicine by mouth with a glass of water. Swallow whole, do not crush, cut, or chew. Follow the directions on the prescription label. You can take this medicine with or without food. Take your doses at regular intervals. Do not take your medicine more often than directed. Talk to your pediatrician regarding the use of this medicine in children. Special care may be needed. While this drug may be prescribed for children as young as 6 years for selected conditions, precautions do apply. Overdosage: If you think you have taken too much of this medicine contact a poison control center or emergency room at once. NOTE: This medicine is only for you. Do not share this medicine with others. What if I miss a dose? If you miss a dose, take it as soon as you can. If it is almost time for your next dose, take only that dose. Do not take double or extra doses. What may interact with this medicine?  antihistamines for allergy, cough and cold  atropine  certain medicines for bladder problems like oxybutynin, tolterodine  certain medicines for  Parkinson's disease like benztropine, trihexyphenidyl  certain medicines for stomach problems like dicyclomine, hyoscyamine  certain medicines for travel sickness like scopolamine  clarithromycin  erythromycin  ipratropium  medicines for fungal infections, like fluconazole, itraconazole, ketoconazole or voriconazole This list may not describe all possible interactions. Give your health care provider a list of all the medicines, herbs, non-prescription drugs, or dietary supplements you use. Also tell them if you smoke, drink alcohol, or use illegal drugs. Some items may interact with your medicine. What should I watch for while using this medicine? It may take a few weeks to notice the full benefit from this medicine. You may need to limit your intake tea, coffee, caffeinated sodas, and alcohol. These drinks may make your symptoms worse. You may get drowsy or dizzy. Do not drive, use machinery, or do anything that needs mental alertness until you know how this medicine affects you. Do not stand or sit up quickly, especially if you are an older patient. This reduces the risk of dizzy or fainting spells. Alcohol may interfere with the effect of this medicine. Avoid alcoholic drinks. Your mouth may get dry. Chewing sugarless gum or sucking hard candy, and drinking plenty of water may help. Contact your doctor if the problem does not go away or is severe. This medicine may cause dry eyes and blurred vision. If you wear contact lenses, you may feel some discomfort. Lubricating drops may help. See your eyecare professional if the problem does not go away or is severe. You may notice the shells of the tablets in your stool from time to time. This  is normal. Avoid extreme heat. This medicine can cause you to sweat less than normal. Your body temperature could increase to dangerous levels, which may lead to heat stroke. What side effects may I notice from receiving this medicine? Side effects that you  should report to your doctor or health care professional as soon as possible:  allergic reactions like skin rash, itching or hives, swelling of the face, lips, or tongue  agitation  breathing problems  confusion  fever  flushing (reddening of the skin)  hallucinations  memory loss  pain or difficulty passing urine  palpitations  unusually weak or tired Side effects that usually do not require medical attention (report to your doctor or health care professional if they continue or are bothersome):  constipation  headache  sexual difficulties (impotence) This list may not describe all possible side effects. Call your doctor for medical advice about side effects. You may report side effects to FDA at 1-800-FDA-1088. Where should I keep my medicine? Keep out of the reach of children. Store at room temperature between 15 and 30 degrees C (59 and 86 degrees F). Protect from moisture and humidity. Throw away any unused medicine after the expiration date. NOTE: This sheet is a summary. It may not cover all possible information. If you have questions about this medicine, talk to your doctor, pharmacist, or health care provider.  2020 Elsevier/Gold Standard (2013-12-26 10:57:06)

## 2020-03-20 ENCOUNTER — Other Ambulatory Visit: Payer: Self-pay | Admitting: Obstetrics and Gynecology

## 2020-03-27 ENCOUNTER — Telehealth: Payer: Self-pay | Admitting: Obstetrics and Gynecology

## 2020-03-27 NOTE — Telephone Encounter (Signed)
Patient would like to speak with nurse about her medication oxybutynin.

## 2020-03-27 NOTE — Telephone Encounter (Signed)
Spoke with patient. Patient was prescribed oxybutynin 10 mg tab on 02/27/20 for OAB. Patient states she is about to finish 30 days, requesting refill until f/u OV on 6/17. Patient reports she is noticing some improvement.   Advised patient Rx sent on 5/6 was for #30/1RF. Patient states she has been using the automated system with [facility] and was advised no refills. Instructed patient to f/u with [facility] to speak with someone directly for refill. Return call to office if any additional assistance is needed. Patient verbalizes understanding and is agreeable.   Routing to provider for final review. Patient is agreeable to disposition. Will close encounter.

## 2020-04-08 ENCOUNTER — Other Ambulatory Visit: Payer: Self-pay

## 2020-04-08 NOTE — Progress Notes (Signed)
GYNECOLOGY  VISIT   HPI: 54 y.o.   Married  Caucasian  female   G2P2 with No LMP recorded. (Menstrual status: Oral contraceptives).   here for medication follow up.    Taking Ditropan Xl 10 mg at hs.  States the Ditropan has helped a lot but is not quite there. Does not need to wear a pad at night but may still be damp.  This is much improved.  Still getting up 1 - 2 times per night.  Voiding about every 2 hours.  Feels like she can hold her urine.  Voiding more slowly but can pass her urine.   No leaking with coughing or stepping off the curb.   Dry mouth, but not all the time.  No constipation.  Has coffee in the am.  Sugar free ginger ale, one daily.  Does have Cystal light with supper.    GYNECOLOGIC HISTORY: No LMP recorded. (Menstrual status: Oral contraceptives). Contraception:  Micronor Menopausal hormone therapy:  none Last mammogram: 10/04/19 BIRADS 1 negative/density b  Last pap smear: 06/2018 normal per patient with [facility]        OB History    Gravida  2   Para  2   Term      Preterm      AB      Living  2     SAB      TAB      Ectopic      Multiple      Live Births                 Patient Active Problem List   Diagnosis Date Noted  . Acute post-traumatic stress disorder 09/09/2019  . Hx of adenomatous polyp of colon 10/10/2017  . Headache 12/28/2015  . Depression 02/28/2014  . Lupus ([place]) 02/28/2014  . GERD (gastroesophageal reflux disease) 02/28/2014  . Insomnia 02/28/2014  . Obesity 02/28/2014    Past Medical History:  Diagnosis Date  . Allergy   . Anxiety    no meds  . Depression    no meds  . Frequent headaches   . GERD (gastroesophageal reflux disease)    diet controlled, no meds   . Hx of adenomatous polyp of colon 10/10/2017  . Lupus ([place])    skin lupus  . SVD (spontaneous vaginal delivery)    x 2  . Urinary incontinence     Past Surgical History:  Procedure Laterality Date  . ANKLE SURGERY Left   .  BLADDER SUSPENSION N/A 10/09/2018   Procedure: TRANSVAGINAL TAPE (TVT) PROCEDURE exact mid-urethral sling;  Surgeon: Nunzio Cobbs, MD;  Location: [facility];  Service: Gynecology;  Laterality: N/A;  . CHOLECYSTECTOMY    . COLONOSCOPY    . CYSTOCELE REPAIR N/A 10/09/2018   Procedure: ANTERIOR REPAIR (CYSTOCELE);  Surgeon: Nunzio Cobbs, MD;  Location: [facility];  Service: Gynecology;  Laterality: N/A;  . CYSTOSCOPY N/A 10/09/2018   Procedure: CYSTOSCOPY;  Surgeon: Nunzio Cobbs, MD;  Location: [facility];  Service: Gynecology;  Laterality: N/A;  . EYE SURGERY Bilateral    Lasik  . FOOT SURGERY Left   . MOUTH SURGERY     deep clean at gum line  . WISDOM TOOTH EXTRACTION      Current Outpatient Medications  Medication Sig Dispense Refill  . cyclobenzaprine (FLEXERIL) 10 MG tablet Take 0.5-1 tablets (5-10 mg total) by mouth at bedtime as needed for muscle spasms. [place]  tablet 1  . Melatonin 10 MG TABS Take 10 mg by mouth at bedtime as needed (sleep).     . norethindrone (MICRONOR,CAMILA,ERRIN) 0.35 MG tablet Take 1 tablet by mouth every evening.     Marland Kitchen oxybutynin (DITROPAN XL) 10 MG 24 hr tablet Take 1 tablet (10 mg total) by mouth at bedtime. 30 tablet 1  . traZODone (DESYREL) 100 MG tablet TAKE 1 AND 1/2 TABLETS BY MOUTH AT BEDTIME AS NEEDED FOR SLEEP. NEEDS APPT FOR REFILLS 135 tablet 6   No current facility-administered medications for this visit.     ALLERGIES: Patient has no known allergies.  Family History  Problem Relation Age of Onset  . Hypertension Mother   . Hyperlipidemia Father   . Heart disease Father   . Hypertension Father   . Diabetes Paternal Grandmother   . Breast cancer Maternal Aunt        age 20's or later  . Colon cancer Maternal Aunt   . Breast cancer Maternal Aunt        age 34's or later  . Breast cancer Maternal Aunt        age 94's or late  . Thyroid disease Sister   . Stroke Maternal Grandmother   . Colon polyps Neg Hx   .  Rectal cancer Neg Hx   . Stomach cancer Neg Hx     Social History   Socioeconomic History  . Marital status: Married    Spouse name: Not on file  . Number of children: Not on file  . Years of education: Not on file  . Highest education level: Not on file  Occupational History  . Not on file  Tobacco Use  . Smoking status: Current Some Day Smoker    Packs/day: 0.25    Types: Cigarettes  . Smokeless tobacco: Never Used  . Tobacco comment: smokes some occasionally   Vaping Use  . Vaping Use: Never used  Substance and Sexual Activity  . Alcohol use: Yes    Alcohol/week: 7.0 standard drinks    Types: 7 Glasses of wine per week  . Drug use: No  . Sexual activity: Yes    Birth control/protection: Pill  Other Topics Concern  . Not on file  Social History Narrative  . Not on file   Social Determinants of Health   Financial Resource Strain:   . Difficulty of Paying Living Expenses:   Food Insecurity:   . Worried About Charity fundraiser in the Last Year:   . Arboriculturist in the Last Year:   Transportation Needs:   . Film/video editor (Medical):   Marland Kitchen Lack of Transportation (Non-Medical):   Physical Activity:   . Days of Exercise per Week:   . Minutes of Exercise per Session:   Stress:   . Feeling of Stress :   Social Connections:   . Frequency of Communication with Friends and Family:   . Frequency of Social Gatherings with Friends and Family:   . Attends Religious Services:   . Active Member of Clubs or Organizations:   . Attends Archivist Meetings:   Marland Kitchen Marital Status:   Intimate Partner Violence:   . Fear of Current or Ex-Partner:   . Emotionally Abused:   Marland Kitchen Physically Abused:   . Sexually Abused:     Review of Systems  All other systems reviewed and are negative.   PHYSICAL EXAMINATION:    BP 120/82 (Cuff Size: Large)   Pulse  70   Temp 97.8 F (36.6 C) (Temporal)   Ht 5\' 4"  (1.626 m)   Wt 208 lb 6.4 oz (94.5 kg)   BMI 35.77 kg/m      General appearance: alert, cooperative and appears stated age  ASSESSMENT  Overactive bladder.   Improved somewhat on Ditropan XL 10 mg.  Normal voiding.  Status post midurethral sling/anterior colporrhaphy/cysto.  PLAN  Will increase Ditropan XL to 15 mg daily at hs. #90, RF 2.  We reviewed bladder irritants.  Biotene, sugar free gum and candy, H20 to improve mouth hydration.  Fu prn.    An After Visit Summary was printed and given to the patient.  __17____ minutes care time.

## 2020-04-09 ENCOUNTER — Encounter: Payer: Self-pay | Admitting: Obstetrics and Gynecology

## 2020-04-09 ENCOUNTER — Ambulatory Visit: Payer: 59 | Admitting: Obstetrics and Gynecology

## 2020-04-09 VITALS — BP 120/82 | HR 70 | Temp 97.8°F | Ht 64.0 in | Wt 208.4 lb

## 2020-04-09 DIAGNOSIS — Z5181 Encounter for therapeutic drug level monitoring: Secondary | ICD-10-CM | POA: Diagnosis not present

## 2020-04-09 DIAGNOSIS — N3281 Overactive bladder: Secondary | ICD-10-CM

## 2020-04-09 MED ORDER — OXYBUTYNIN CHLORIDE ER 15 MG PO TB24: 15.0000 mg | ORAL_TABLET | Freq: Every day | ORAL | 2 refills | Status: DC

## 2020-04-18 ENCOUNTER — Other Ambulatory Visit: Payer: Self-pay | Admitting: Obstetrics and Gynecology

## 2021-01-14 ENCOUNTER — Telehealth: Payer: Self-pay | Admitting: Family Medicine

## 2021-01-14 ENCOUNTER — Encounter: Payer: Self-pay | Admitting: Family Medicine

## 2021-01-14 NOTE — Telephone Encounter (Signed)
This is fine. Thanks

## 2021-01-14 NOTE — Telephone Encounter (Signed)
This pt called and wanted to see if you would be willing to take her on as a new pt. She was referred by one of your other pts Davis Gourd who told her you was an excellent Dr.

## 2021-01-27 NOTE — Progress Notes (Signed)
Chief Complaint  Patient presents with  . Establish Care    New patient to establish. Was really hoping to do a CPE today. Has been having over the last few months some chest pressure/pain that moves around-sometimes some heartburn. She notices it when she lays down at night. Has some concerns about the oxybutynin she is taking-has concerns about dementia. Is there anything else she can take?   Marland Kitchen Covid Vaccine     Declined covid booster.    Patient presents to establish care.  Within the last year, she noticed a pressure in her chest one night, seemed to move around with position changes, and pain also moves side to side.  It improved if she would sit up. Mostly is a pressure, sometimes is described as painful. No nausea or burning in chest. Some heartburn during the day, infrequently. No chest pain with activity. Doesn't get regular exercise  She tried taking Tums during the day, didn't help with heartburn.  Didn't try anything for night time symptoms. Occurs less than once a week, sometimes lasts for a few days in a row. Overall estimates it occurring about 2x/month.   OAB--under care of Dr. Quincy Simmonds (s/p surgery in past).  Dose of Ditropran increased from 10 to 15 mg in 03/2020.  It helps some, but she still has some leakage, mainly at night.  She did well for a year after surgery, but then had recurrent issues with leakage, and was then started on medications. She does some Kegel's exercises, not every day. She denies dysuria, hematuria. She sees Dr. Harrington Challenger for her GYN now ([facility]), for paps.  H/o adenomatous colon polyp--10 mm in size in 09/2017 with Dr. Carlean Purl.  She is overdue for 3 year follow-up.  She smokes occasionally, often related to when she is drinking wine. She reports she has gained weight, and isn't happy with her weight.  She is having some insomnia.  It started related to work stress, and she was started on Trazadone. She used to be able to use it just as needed, but has  been needing it nightly.  She still sometimes has trouble staying asleep.  Denies depression.   Past Medical History:  Diagnosis Date  . Allergy    seasonal  . Anxiety    no meds  . Depression    no meds  . Frequent headaches   . GERD (gastroesophageal reflux disease)    diet controlled, no meds   . Hx of adenomatous polyp of colon 10/10/2017  . Lupus ([place])    skin lupus (Dr. Allyson Sabal)  . SVD (spontaneous vaginal delivery)    x 2  . Urinary incontinence    Past Surgical History:  Procedure Laterality Date  . ANKLE SURGERY Left   . BLADDER SUSPENSION N/A 10/09/2018   Procedure: TRANSVAGINAL TAPE (TVT) PROCEDURE exact mid-urethral sling;  Surgeon: Nunzio Cobbs, MD;  Location: [facility];  Service: Gynecology;  Laterality: N/A;  . CHOLECYSTECTOMY    . COLONOSCOPY    . CYSTOCELE REPAIR N/A 10/09/2018   Procedure: ANTERIOR REPAIR (CYSTOCELE);  Surgeon: Nunzio Cobbs, MD;  Location: [facility];  Service: Gynecology;  Laterality: N/A;  . CYSTOSCOPY N/A 10/09/2018   Procedure: CYSTOSCOPY;  Surgeon: Nunzio Cobbs, MD;  Location: [facility];  Service: Gynecology;  Laterality: N/A;  . EYE SURGERY Bilateral    Lasik  . FOOT SURGERY Left   . MOUTH SURGERY     deep clean at gum  line  . WISDOM TOOTH EXTRACTION     Social History   Social History Narrative  . Not on file   Social History   Tobacco Use  . Smoking status: Current Some Day Smoker    Packs/day: 0.25    Types: Cigarettes  . Smokeless tobacco: Never Used  . Tobacco comment: smokes some occasionally (0-7/d, when having wine)  Vaping Use  . Vaping Use: Never used  Substance Use Topics  . Alcohol use: Yes    Alcohol/week: 7.0 standard drinks    Types: 7 Glasses of wine per week    Comment: 1-2 glasses of wine 3-7 days/week  . Drug use: No    Immunization History  Administered Date(s) Administered  . Influenza,inj,Quad PF,6+ Mos 07/24/2017  . Influenza-Unspecified 07/24/2016, 07/24/2018,  08/16/2019  . Moderna Sars-Covid-2 Vaccination 01/10/2020, 02/11/2020   She did not get a flu shot this year. She hasn't had booster, is unsure if she wants one. Cannot recall last tetanus (many years ago) Never had Shingrix  Outpatient Encounter Medications as of 01/28/2021  Medication Sig Note  . Multiple Vitamin (MULTIVITAMIN PO) Take 2 each by mouth daily. 01/28/2021: VitaFusion Gummy  . norethindrone (MICRONOR,CAMILA,ERRIN) 0.35 MG tablet Take 1 tablet by mouth every evening.    Marland Kitchen oxybutynin (DITROPAN XL) 15 MG 24 hr tablet Take 1 tablet (15 mg total) by mouth at bedtime.   . traZODone (DESYREL) 100 MG tablet TAKE 1 AND 1/2 TABLETS BY MOUTH AT BEDTIME AS NEEDED FOR SLEEP. NEEDS APPT FOR REFILLS   . Melatonin 10 MG TABS Take 10 mg by mouth at bedtime as needed (sleep).  (Patient not taking: Reported on 01/28/2021)   . [DISCONTINUED] Trospium Chloride 60 MG CP24 Take 1 capsule by mouth daily. (Patient not taking: No sig reported) 01/28/2021: Never filled-insurance would not cover   No facility-administered encounter medications on file as of 01/28/2021.   No Known Allergies  ROS: no fever, chills, headaches, dizziness, URI symptoms.  Chest pain, heartburn per HPI. No urinary complaints except leakage, usually at night.   +weight gain (only up 1 pound since June per chart) Moods okay.   PHYSICAL EXAM:  BP 130/78   Pulse 72   Ht 5\' 4"  (1.626 m)   Wt 209 lb 9.6 oz (95.1 kg)   BMI 35.98 kg/m   Wt Readings from Last 3 Encounters:  01/28/21 209 lb 9.6 oz (95.1 kg)  04/09/20 208 lb 6.4 oz (94.5 kg)  02/27/20 206 lb (93.4 kg)    Well-appearing, pleasant female, in good spirits HEENT: conjunctiva and sclera are clear, EOMI, wearing mask Neck: no lymphadenopathy, thyromegaly or carotid bruit Heart: regular rate and rhythm, no murmur Lungs: clear bilaterally Chest wall: tender at 1-2 levels at mid-lower costochondral junctions bilaterally Back: no spinal or CVA tenderness Abdomen: soft,  nontender, no organomegaly or mass Extremities: 2+ pulses, no edema Neuro: alert and oriented. Normal strength, gait.   Psych: normal mood, affect, hygiene and grooming   ASSESSMENT/PLAN:  Gastroesophageal reflux disease, unspecified whether esophagitis present - Poss contributing to nighttime sx as well as heartburn. Reviewed proper diet/precautions in detail  Need for Tdap vaccination - Plan: Tdap vaccine greater than or equal to 7yo IM  Chest wall pain - mild costochondritis.  Heating pad/compresses, NSAID prn. May contribute to her nighttime pain (positional component)  Overactive bladder - some SE from meds. Given names of others to check insurance, possibly can try to see if more effective at lower doses, less SE. Proper  Kegel's reviewed  BMI 35.0-35.9,adult - counseled on healthy diet, exercise. Encouraged food journal, bring to f/u visit (just journaling will likely leed to better choices)  Hx of adenomatous polyp of colon - past due for f/u colonoscopy.  Advised to contact [facility] and schedule  Vaccine counseling - discussed COVID boosters, encouraged her to get.  Needs to wait 2 weeks from today due to Tdap being given. Shingrix rec--to check insurance  Insomnia, unspecified type - cont Trazadone prn.  Discussed caffeine, alcohol, tobacco effects, discussed exercise and stress reduction  Tobacco use - counseled re: risks of smoking encouraged cessation  Counseled in detail about diet for reflux. Discussed alcohol intake (and effect on sleep, the fact that she smokes when she drinks, and that she wants to lose weight). Encouraged her to cut back on wine intake, stop smoking. Encouraged regular exercise. To keep food journal and bring to f/u.  Discussed how journaling can help with accountability, and might help with weight loss in and of itself.  I encouraged her to bring it to next visit so we can review her diet and offer suggestions.   To schedule CPE, f/u sooner  prn.  I spent 62 minutes dedicated to the care of this patient, including pre-visit review of records, face to face time, post-visit ordering of testing and documentation.

## 2021-01-28 ENCOUNTER — Encounter: Payer: Self-pay | Admitting: Family Medicine

## 2021-01-28 ENCOUNTER — Other Ambulatory Visit: Payer: Self-pay

## 2021-01-28 ENCOUNTER — Ambulatory Visit: Payer: 59 | Admitting: Family Medicine

## 2021-01-28 VITALS — BP 130/78 | HR 72 | Ht 64.0 in | Wt 209.6 lb

## 2021-01-28 DIAGNOSIS — Z23 Encounter for immunization: Secondary | ICD-10-CM

## 2021-01-28 DIAGNOSIS — Z7185 Encounter for immunization safety counseling: Secondary | ICD-10-CM

## 2021-01-28 DIAGNOSIS — Z720 Tobacco use: Secondary | ICD-10-CM

## 2021-01-28 DIAGNOSIS — N3281 Overactive bladder: Secondary | ICD-10-CM

## 2021-01-28 DIAGNOSIS — R0789 Other chest pain: Secondary | ICD-10-CM

## 2021-01-28 DIAGNOSIS — K219 Gastro-esophageal reflux disease without esophagitis: Secondary | ICD-10-CM

## 2021-01-28 DIAGNOSIS — G4700 Insomnia, unspecified: Secondary | ICD-10-CM

## 2021-01-28 DIAGNOSIS — Z6835 Body mass index (BMI) 35.0-35.9, adult: Secondary | ICD-10-CM

## 2021-01-28 DIAGNOSIS — Z86010 Personal history of colonic polyps: Secondary | ICD-10-CM

## 2021-01-28 NOTE — Patient Instructions (Addendum)
Try and do the Kegel's exercises more frequently as we discussed. If Dr. Quincy Simmonds suggested physical therapy as a treatment option, then this would be a good next step if not improving (I like the therapists at [facility]).  Other options to consider in place of oxybutynin are Vesicare, Detrol (same class as oxybutynin--may have same side effects/risks but may be more effective and possibly use lower doses). Myrbetriq is a different class entirely likely better safety protocol and side effects.  Check the websites of the medications (name of med with ".com" has coupons and copay cards.  Check there in addition to GoodRx for cost issues.  Please contact Dr. Celesta Aver office to schedule your follow-up colonoscopy.   You are likely due for a tetanus booster (if more than 10 years). We gave this today.  I recommend getting the new shingles vaccine (Shingrix).Check with your insurance to verify what your out of pocket cost may be (usually covered as preventative, but better to verify to avoid any surprises, as this vaccine is expensive), and then schedule a nurse visit at our office when convenient (based on the possible side effects as discussed, or we can give it at your physical).   This is a series of 2 injections, spaced 2 months apart.  It doesn't have to be exactly 2 months apart (but can't be under 2 months), if that isn't feasible for your schedule, but try and get them close to 2 months (and definitely within 6 months of each other, or else the efficacy of the vaccine drops off).  Use Pepcid complete instead of Tums if needed for heartburn. You can also take pepcid more regularly prior to meals that might trigger heartburn/chest pain in order to PREVENT.  Keep some notes regarding your chest symptoms (triggers--what foods, alcohol, late eating, large meals, versus exertion). Consider keeping a diet journal for a week or two prior to your next visit.   Food Choices for Gastroesophageal  Reflux Disease, Adult When you have gastroesophageal reflux disease (GERD), the foods you eat and your eating habits are very important. Choosing the right foods can help ease the discomfort of GERD. Consider working with a dietitian to help you make healthy food choices. What are tips for following this plan? Reading food labels  Look for foods that are low in saturated fat. Foods that have less than 5% of daily value (DV) of fat and 0 g of trans fats may help with your symptoms. Cooking  Cook foods using methods other than frying. This may include baking, steaming, grilling, or broiling. These are all methods that do not need a lot of fat for cooking.  To add flavor, try to use herbs that are low in spice and acidity. Meal planning  Choose healthy foods that are low in fat, such as fruits, vegetables, whole grains, low-fat dairy products, lean meats, fish, and poultry.  Eat frequent, small meals instead of three large meals each day. Eat your meals slowly, in a relaxed setting. Avoid bending over or lying down until 2-3 hours after eating.  Limit high-fat foods such as fatty meats or fried foods.  Limit your intake of fatty foods, such as oils, butter, and shortening.  Avoid the following as told by your health care provider: ? Foods that cause symptoms. These may be different for different people. Keep a food diary to keep track of foods that cause symptoms. ? Alcohol. ? Drinking large amounts of liquid with meals. ? Eating meals during the 2-3  hours before bed.   Lifestyle  Maintain a healthy weight. Ask your health care provider what weight is healthy for you. If you need to lose weight, work with your health care provider to do so safely.  Exercise for at least 30 minutes on 5 or more days each week, or as told by your health care provider.  Avoid wearing clothes that fit tightly around your waist and chest.  Do not use any products that contain nicotine or tobacco. These  products include cigarettes, chewing tobacco, and vaping devices, such as e-cigarettes. If you need help quitting, ask your health care provider.  Sleep with the head of your bed raised. Use a wedge under the mattress or blocks under the bed frame to raise the head of the bed.  Chew sugar-free gum after mealtimes. What foods should I eat? Eat a healthy, well-balanced diet of fruits, vegetables, whole grains, low-fat dairy products, lean meats, fish, and poultry. Each person is different. Foods that may trigger symptoms in one person may not trigger any symptoms in another person. Work with your health care provider to identify foods that are safe for you. The items listed above may not be a complete list of recommended foods and beverages. Contact a dietitian for more information.   What foods should I avoid? Limiting some of these foods may help manage the symptoms of GERD. Everyone is different. Consult a dietitian or your health care provider to help you identify the exact foods to avoid, if any. Fruits Any fruits prepared with added fat. Any fruits that cause symptoms. For some people this may include citrus fruits, such as oranges, grapefruit, pineapple, and lemons. Vegetables Deep-fried vegetables. [place] fries. Any vegetables prepared with added fat. Any vegetables that cause symptoms. For some people, this may include tomatoes and tomato products, chili peppers, onions and garlic, and horseradish. Grains Pastries or quick breads with added fat. Meats and other proteins High-fat meats, such as fatty beef or pork, hot dogs, ribs, ham, sausage, salami, and bacon. Fried meat or protein, including fried fish and fried chicken. Nuts and nut butters, in large amounts. Dairy Whole milk and chocolate milk. Sour cream. Cream. Ice cream. Cream cheese. Milkshakes. Fats and oils Butter. Margarine. Shortening. Ghee. Beverages Coffee and tea, with or without caffeine. Carbonated beverages. Sodas.  Energy drinks. Fruit juice made with acidic fruits, such as orange or grapefruit. Tomato juice. Alcoholic drinks. Sweets and desserts Chocolate and cocoa. Donuts. Seasonings and condiments Pepper. Peppermint and spearmint. Added salt. Any condiments, herbs, or seasonings that cause symptoms. For some people, this may include curry, hot sauce, or vinegar-based salad dressings. The items listed above may not be a complete list of foods and beverages to avoid. Contact a dietitian for more information. Questions to ask your health care provider Diet and lifestyle changes are usually the first steps that are taken to manage symptoms of GERD. If diet and lifestyle changes do not improve your symptoms, talk with your health care provider about taking medicines. Where to find more information  International Foundation for Gastrointestinal Disorders: aboutgerd.org Summary  When you have gastroesophageal reflux disease (GERD), food and lifestyle choices may be very helpful in easing the discomfort of GERD.  Eat frequent, small meals instead of three large meals each day. Eat your meals slowly, in a relaxed setting. Avoid bending over or lying down until 2-3 hours after eating.  Limit high-fat foods such as fatty meats or fried foods. This information is not intended to replace  advice given to you by your health care provider. Make sure you discuss any questions you have with your health care provider. Document Revised: 04/20/2020 Document Reviewed: 04/20/2020 Elsevier Patient Education  [place].

## 2021-02-28 NOTE — Patient Instructions (Addendum)
HEALTH MAINTENANCE RECOMMENDATIONS:  It is recommended that you get at least 30 minutes of aerobic exercise at least 5 days/week (for weight loss, you may need as much as 60-90 minutes). This can be any activity that gets your heart rate up. This can be divided in 10-15 minute intervals if needed, but try and build up your endurance at least once a week.  Weight bearing exercise is also recommended twice weekly.  Eat a healthy diet with lots of vegetables, fruits and fiber.  "Colorful" foods have a lot of vitamins (ie green vegetables, tomatoes, red peppers, etc).  Limit sweet tea, regular sodas and alcoholic beverages, all of which has a lot of calories and sugar.  Up to 1 alcoholic drink daily may be beneficial for women (unless trying to lose weight, watch sugars).  Drink a lot of water.  Calcium recommendations are 1200-1500 mg daily (1500 mg for postmenopausal women or women without ovaries), and vitamin D 1000 IU daily.  This should be obtained from diet and/or supplements (vitamins), and calcium should not be taken all at once, but in divided doses.  Monthly self breast exams and yearly mammograms for women over the age of 40 is recommended.  Sunscreen of at least SPF 30 should be used on all sun-exposed parts of the skin when outside between the hours of 10 am and 4 pm (not just when at beach or pool, but even with exercise, golf, tennis, and yard work!)  Use a sunscreen that says "broad spectrum" so it covers both UVA and UVB rays, and make sure to reapply every 1-2 hours.  Remember to change the batteries in your smoke detectors when changing your clock times in the spring and fall. Carbon monoxide detectors are recommended for your home.  Use your seat belt every time you are in a car, and please drive safely and not be distracted with cell phones and texting while driving.  [facility] and schedule your yearly 3D mammogram.  Please work on quitting smoking (and then  you can avoid needing the pneumonia vaccine--only needed at this age if you continue to smoke).  Return in 2 months for your 2nd shingrix vaccine.  Pepcid Complete should be used WHEN you have symptoms of heartburn. Pepcid AC (famotidine) should be used prior to meals (preventatively--either regularly, or before meals that are known to trigger heartburn, such as pizza).  We are changing your oxybutynin to a trial of Myrbetriq instead. STOP OXYBUTYNIN/DITROPAN. Start 25mg  Myrbetriq.  If this is helping, let us know and we can send in a 90 day prescription.  If it only helps a little, and no side effects, let us know and we can increase the dose to 50mg  tablet.   STOP TAKING IBUPROFEN AND ALEVE WHILE TAKING MELOXICAM. You MAY use Tylenol along with the  Meloxicam, if needed for any pain.  Take the meloxicam once daily with food, until your pain has resolved (which may be sooner than the full 15 day course).  If your pains don't resolve--in the hip, we can do a cortisone injection.  I do not see any significant bursitis, tendonitis or carpal tunnel issues in the shoulder or wrist to warrant any injections there.  Take the pepcid daily (up to twice daily, if needed if it bothers your stomach) while taking the meloxicam   Hip Bursitis  Hip bursitis is the inflammation of one or more bursae in the hip joint. Bursae are small fluid-filled sacs that absorb shock  and prevent bones from rubbing against each other. Hip bursitis can cause mild to moderate pain, and symptoms often come and go over time. What are the causes? This condition results from increased friction between the hip bones and the tendons around the hip joint. This condition can happen if you:  Overuse your hip muscles.  Injure your hip.  Have weak buttocks muscles.  Have bone spurs.  Have an infection. In some cases, the cause may not be known. What increases the risk? You are more likely to develop this condition  if:  You injured your hip previously or had hip surgery.  You have a medical condition, such as arthritis, gout, diabetes, or thyroid disease.  You have spine problems.  You have one leg that is shorter than the other.  You participate in athletic activities that include repetitive motion, like running.  You participate in sports where there is a risk of injury or falling, such as football, martial arts, or skiing. What are the signs or symptoms? Symptoms may come and go, and they often include:  Pain in the hip or groin area. Pain may get worse with movement.  Tenderness and swelling of the hip. In rare cases, the bursa may become infected. If this happens, you may get a fever, as well as warmth and redness in the hip area. How is this diagnosed? This condition may be diagnosed based on:  Your symptoms.  Your medical history.  A physical exam.  Imaging tests, such as: ? X-rays to check your bones. ? MRI or ultrasound to check your tendons and muscles. ? Bone scan.  A biopsy to remove fluid from your inflamed bursa for testing. How is this treated? This condition is treated by resting, icing, applying pressure (compression), and raising (elevating) the injured area. This is called RICE treatment. In some cases, RICE treatment may not be enough to make your symptoms go away. Treatment may also include:  Taking medicine to help with swelling and pain.  Using crutches, a cane, or a walker to decrease the strain on your hip.  Getting a shot of cortisone medicine to help reduce swelling.  Taking other medicines if the bursa is infected.  Draining fluid out of the bursa to help relieve swelling.  Having surgery to remove a damaged or infected bursa. This is rare. Long-term treatment may include:  Physical therapy exercises for strength and flexibility.  Lifestyle changes, such as weight loss, to reduce the strain on the hip. Follow these instructions at  home: Managing pain, stiffness, and swelling  If directed, put ice on the painful area. ? Put ice in a plastic bag. ? Place a towel between your skin and the bag. ? Leave the ice on for 20 minutes, 2-3 times a day.  Raise (elevate) your hip as much as you can without pain. To do this, put a pillow under your hips while you lie down.  If directed, apply heat to the affected area as often as told by your health care provider. Use the heat source that your health care provider recommends, such as a moist heat pack or a heating pad. ? Place a towel between your skin and the heat source. ? Leave the heat on for 20-30 minutes. ? Remove the heat if your skin turns bright red. This is especially important if you are unable to feel pain, heat, or cold. You may have a greater risk of getting burned.      Activity  Do not  use your hip to support your body weight until your health care provider says that you can. Use crutches, a cane, or a walker as told by your health care provider.  If the affected leg is one that you use to drive, ask your health care provider if it is safe to drive.  Rest and protect your hip as much as possible until your pain and swelling get better.  Return to your normal activities as told by your health care provider. Ask your health care provider what activities are safe for you.  Do exercises as told by your health care provider. General instructions  Take over-the-counter and prescription medicines only as told by your health care provider.  Gently massage and stretch your injured area as often as is comfortable.  Wear compression wraps only as told by your health care provider.  If one of your legs is shorter than the other, get fitted for a shoe insert or orthotic.  Maintain a healthy weight. Follow instructions from your health care provider for weight control. These may include dietary restrictions.  Keep all follow-up visits as told by your health care  provider. This is important. How is this prevented?  Exercise regularly, as told by your health care provider.  Wear supportive footwear that is appropriate for your sport.  Warm up and stretch before being active. Cool down and stretch after being active.  Take breaks regularly from repetitive activity.  If an activity irritates your hip or causes pain, avoid the activity as much as possible.  Avoid sitting down for long periods at a time. Where to find more information  American [employer]: orthoinfo.aaos.org Contact a health care provider if:  You have a fever.  You develop new symptoms.  You have trouble walking or doing everyday activities.  You have pain that gets worse or does not get better with medicine.  You develop red skin or a feeling of warmth in your hip area. Get help right away if:  You cannot move your hip.  You have severe pain.  You cannot control the muscles in your feet. Summary  Hip bursitis is the inflammation of one or more bursae in the hip joint. Bursae are small fluid-filled sacs that absorb shock and prevent bones from rubbing against each other.  Hip bursitis can cause hip or groin pain, and symptoms often come and go over time.  This condition is often treated by resting, icing, applying pressure (compression), and raising (elevating) the injured area. Other treatments may be needed. This information is not intended to replace advice given to you by your health care provider. Make sure you discuss any questions you have with your health care provider. Document Revised: 08/12/2019 Document Reviewed: 06/18/2018 Elsevier Patient Education  2021 [employer].

## 2021-02-28 NOTE — Progress Notes (Signed)
Chief Complaint  Patient presents with  . Annual Exam    Fasting (had 5 french fries at noon) annual exam, no pap- Did see Dr.Ross 07/2019 and 11/21-not sure if she had pap or pelvic, I can get records. Does not want covid booster, but is open to shingles and possibly pneumonia vaccine. Will schedule appt with Dr. Gershon Crane. She has been having right shoulder and wrist pain. Shoulder is off and on. Wrist pain is more daily.    Jane Valdez is a 55 y.o. female who presents for a complete physical.  She sees Dr. Harrington Challenger for GYN care.  She has the following concerns: R shoulder and wrist pain. H/o injection in L shoulder in the past, pain resolved.  R shoulder hurts to sleep on it, and reaching overhead. No known injury.  She has some R wrist pain, especially when on the computer a lot.  Pain is across the anterior aspect of wrist, and in her thumbs, and also the ulnar aspect. No numbness/tingling. Hurts most days, finding herself rubbing the bones. She has a brace, also uses glove with no fingers, can't tell if it helps. Only uses it prn (with a lot of typing).  She also has some pain in her hips, notices at night when sleeping/rolling over.  OAB--She checked her insurance, and she would like to try Myrbetriq.  She is currently using 46m oxybutynin--it is helping (not 100%), but causing dry mouth, dry eyes.  Chest wall pain has resolved. Heartburn--she has been taking Pepcid AC, using it prn when has symptoms. Sometimes she feels it at night, when she lays down, and sometimes in the morning, even before she eats.  Only had 3-4 episodes since last visit. Last episode was after pizza.  Smoking the same--with wine, none during the day, in the evenings. Hasn't started journaling food yet or made the changes we discussed at recent visit.  Insomnia--she is using 1081mof Trazadone nightly.  Still sometimes has trouble staying asleep.  Denies depression. Sleep can be interfered with by  pain--hip or shoulder pain waking her up. Sometimes she has trouble sleeping related to stress/work.   She has no refills, but has another bottle at home, doesn't need yet.  She uses Aleve, ibuprofen and tylenol 4-5 days/week for her various pains (hip, shoulder, wrists, LBP after cleaning).   Immunization History  Administered Date(s) Administered  . Influenza,inj,Quad PF,6+ Mos 07/24/2017  . Influenza-Unspecified 07/24/2016, 07/24/2018, 08/16/2019  . Moderna Sars-Covid-2 Vaccination 01/10/2020, 02/11/2020  . Tdap 01/28/2021   Last Pap smear: 07/2019 by Dr. RoHarrington Challengerno results in Epic; 07/12/2018 was prior pap, noted to be normal). Had annual visit 08/2020 Last mammogram: 09/2019 Last colonoscopy: 09/2017 with Dr. GeCarlean Purl1072mdenomatous colon polyp; past due for 3 yr f/u, scheduled for 04/23/2021 Last DEXA: never Dentist: twice a year Ophtho: due (last went a few years ago) Exercise: Nothing regular.  She has a walking exercise tape, hasn't been using.  Lipids: Lab Results  Component Value Date   CHOL 168 02/01/2017   HDL 53.80 02/01/2017   LDLCALC 101 (H) 02/01/2017   TRIG 67.0 02/01/2017   CHOLHDL 3 02/01/2017     PMH, PSH, SH and FH reviewed  Outpatient Encounter Medications as of 03/01/2021  Medication Sig Note  . Multiple Vitamin (MULTIVITAMIN PO) Take 2 each by mouth daily. 01/28/2021: VitaFusion Gummy  . norethindrone (MICRONOR,CAMILA,ERRIN) 0.35 MG tablet Take 1 tablet by mouth every evening.    . oMarland Kitchenybutynin (DITROPAN XL) 15 MG 24  hr tablet Take 1 tablet (15 mg total) by mouth at bedtime.   . traZODone (DESYREL) 100 MG tablet TAKE 1 AND 1/2 TABLETS BY MOUTH AT BEDTIME AS NEEDED FOR SLEEP. NEEDS APPT FOR REFILLS   . Melatonin 10 MG TABS Take 10 mg by mouth at bedtime as needed (sleep).  (Patient not taking: No sig reported)    No facility-administered encounter medications on file as of 03/01/2021.   Takes ibuprofen or aleve and/or tylenol at least 4x/week.  No Known  Allergies  ROS:  The patient denies anorexia, fever, recent weight changes, headaches,  vision changes, decreased hearing, ear pain, sore throat, breast concerns, chest pain, palpitations, dizziness, syncope, dyspnea on exertion, cough, swelling, nausea, vomiting, diarrhea, constipation, abdominal pain, melena, hematochezia, hematuria, dysuria, vaginal discharge, odor or itch, genital lesions, numbness, tingling, weakness, tremor, suspicious skin lesions, depression, anxiety, abnormal bleeding/bruising, or enlarged lymph nodes. Heartburn per HPI. Mild hot flashes. Occasional spotting on the micronor (very light/minimal). Chest wall pain resolved Insomnia--relieved by trazadone, per HPI Urinary leakage overnight/in the mornings.  No longer has stress incontinence. Hip pain, LBP, R shoulder and wrist pain (R>L) per HPI    PHYSICAL EXAM:  BP 132/84   Pulse 76   Ht '5\' 4"'  (1.626 m)   Wt 210 lb 3.2 oz (95.3 kg)   BMI 36.08 kg/m   Wt Readings from Last 3 Encounters:  03/01/21 210 lb 3.2 oz (95.3 kg)  01/28/21 209 lb 9.6 oz (95.1 kg)  04/09/20 208 lb 6.4 oz (94.5 kg)    General Appearance:    Alert, cooperative, no distress, appears stated age. Not changed into gown.  Head:    Normocephalic, without obvious abnormality, atraumatic  Eyes:    PERRL, conjunctiva/corneas clear, EOM's intact, fundi    benign  Ears:    Normal TM's and external ear canals  Nose:   Not examined, wearing mask due to COVID-19 pandemic  Throat:   Not examined, wearing mask due to COVID-19 pandemic  Neck:   Supple, no lymphadenopathy;  thyroid:  no enlargement/ tenderness/nodules; no carotid bruit or JVD  Back:    Spine nontender, no curvature, ROM normal, no CVA     tenderness  Lungs:     Clear to auscultation bilaterally without wheezes, rales or     ronchi; respirations unlabored  Chest Wall:    Mild tenderness at 2 levels at L costochondral junctions in mid-lower chest   Heart:    Regular rate and rhythm, S1  and S2 normal, no murmur, rub   or gallop  Breast Exam:    Deferred to GYN  Abdomen:     Soft, non-tender, nondistended, normoactive bowel sounds,    no masses, no hepatosplenomegaly  Genitalia:    Deferred to GYN     Extremities:   No clubbing, cyanosis or edema. Tender at trochanteric bursa bilaterally.  Negative Phalen and Tinel, wrists nontender, no visible swelling, FROM.  FROM of R shoulder, normal strength, no pain with rotator cuff testing. Some pain at anterior shoulder with reaching R arm over L shoulder. No impingement.  Pulses:   2+ and symmetric all extremities  Skin:   Skin color, texture, turgor normal, no rashes or lesions  Lymph nodes:   Cervical, supraclavicular, and axillary nodes normal  Neurologic:   CNII-XII intact, normal strength, sensation and gait; reflexes 2+ and symmetric throughout          Psych:   Normal mood, affect, hygiene and grooming.  ASSESSMENT/PLAN:  Annual physical exam - Plan: POCT Urinalysis DIP (Proadvantage Device), CBC with Differential/Platelet, Comprehensive metabolic panel, Lipid panel, VITAMIN D 25 Hydroxy (Vit-D Deficiency, Fractures)  Need for shingles vaccine - Plan: Varicella-zoster vaccine IM (Shingrix)  Trochanteric bursitis of both hips - Not interested in injection. Will try NSAID, which will also help with wrist/shoulder/LB pain. NSAID risks/precautions reviewed - Plan: meloxicam (MOBIC) 15 MG tablet  Overactive bladder - stop ditropan; trial of Myrbetriq 79m (can titrate up in future, if needed) - Plan: mirabegron ER (MYRBETRIQ) 25 MG TB24 tablet  Gastroesophageal reflux disease, unspecified whether esophagitis present - reviewed proper use of H2 blocker--to use PRIOR to meal, vs uses Pepcid Complete when she has heartburn. Diet/precautions reviewed  BMI 36.0-36.9,adult - counseled re: diet, exercise, weight loss.  Right shoulder pain, unspecified chronicity - suspect some OA; no e/o RC weakness or tendinopathy. Trial of  NSAID.  Has full ROM  Pain in wrist, unspecified laterality - R>L, no e/o CTS. Suspect overuse, positioning with typing. Trial of NSAIDs    Need pap results of 07/2019 with Dr. RHarrington Challenger and notes from 08/2020 visit (with pap results, if done).  c-met, cbc, lipids, vitamin D (pt states never checked).  Discussed monthly self breast exams and yearly mammograms (overdue, reminded to schedule); at least 30 minutes of aerobic activity at least 5 days/week, weight-bearing exercise at least 2x/week; proper sunscreen use reviewed; healthy diet, including goals of calcium and vitamin D intake and alcohol recommendations (less than or equal to 1 drink/day) reviewed; regular seatbelt use; changing batteries in smoke detectors.  Immunization recommendations discussed--Shingrix #1 given today.  COVID booster is recommended. Discussed pneumovax if continues to smoke.  Colonoscopy recommendations reviewed--past due, but scheduled.  F/u 2 mos for Shingrix #2, CPE 1 year, sooner prn

## 2021-03-01 ENCOUNTER — Other Ambulatory Visit: Payer: Self-pay

## 2021-03-01 ENCOUNTER — Ambulatory Visit (INDEPENDENT_AMBULATORY_CARE_PROVIDER_SITE_OTHER): Payer: 59 | Admitting: Family Medicine

## 2021-03-01 ENCOUNTER — Encounter: Payer: Self-pay | Admitting: Family Medicine

## 2021-03-01 VITALS — BP 132/84 | HR 76 | Ht 64.0 in | Wt 210.2 lb

## 2021-03-01 DIAGNOSIS — Z0000 Encounter for general adult medical examination without abnormal findings: Secondary | ICD-10-CM | POA: Diagnosis not present

## 2021-03-01 DIAGNOSIS — K219 Gastro-esophageal reflux disease without esophagitis: Secondary | ICD-10-CM | POA: Diagnosis not present

## 2021-03-01 DIAGNOSIS — M25539 Pain in unspecified wrist: Secondary | ICD-10-CM

## 2021-03-01 DIAGNOSIS — Z23 Encounter for immunization: Secondary | ICD-10-CM

## 2021-03-01 DIAGNOSIS — Z6836 Body mass index (BMI) 36.0-36.9, adult: Secondary | ICD-10-CM | POA: Diagnosis not present

## 2021-03-01 DIAGNOSIS — M25511 Pain in right shoulder: Secondary | ICD-10-CM

## 2021-03-01 DIAGNOSIS — N3281 Overactive bladder: Secondary | ICD-10-CM

## 2021-03-01 DIAGNOSIS — M7061 Trochanteric bursitis, right hip: Secondary | ICD-10-CM | POA: Diagnosis not present

## 2021-03-01 DIAGNOSIS — M7062 Trochanteric bursitis, left hip: Secondary | ICD-10-CM

## 2021-03-01 LAB — POCT URINALYSIS DIP (PROADVANTAGE DEVICE)
Bilirubin, UA: NEGATIVE
Blood, UA: NEGATIVE
Glucose, UA: NEGATIVE mg/dL
Ketones, POC UA: NEGATIVE mg/dL
Nitrite, UA: NEGATIVE
Specific Gravity, Urine: 1.01
Urobilinogen, Ur: NEGATIVE
pH, UA: 8 (ref 5.0–8.0)

## 2021-03-01 LAB — CBC WITH DIFFERENTIAL/PLATELET
Immature Granulocytes: 0 %
Neutrophils: 59 %

## 2021-03-01 MED ORDER — MIRABEGRON ER 25 MG PO TB24: 25.0000 mg | ORAL_TABLET | Freq: Every day | ORAL | 0 refills | Status: DC

## 2021-03-01 MED ORDER — MELOXICAM 15 MG PO TABS: 15.0000 mg | ORAL_TABLET | Freq: Every day | ORAL | 0 refills | Status: DC

## 2021-03-02 ENCOUNTER — Encounter: Payer: Self-pay | Admitting: Family Medicine

## 2021-03-02 LAB — LIPID PANEL
Chol/HDL Ratio: 3.2 ratio (ref 0.0–4.4)
Cholesterol, Total: 172 mg/dL (ref 100–199)
HDL: 54 mg/dL (ref >39)
LDL Chol Calc (NIH): 102 mg/dL — ABNORMAL HIGH (ref 0–99)
Triglycerides: 86 mg/dL (ref 0–149)
VLDL Cholesterol Cal: 16 mg/dL (ref 5–40)

## 2021-03-02 LAB — COMPREHENSIVE METABOLIC PANEL
ALT: 16 IU/L (ref 0–32)
AST: 18 IU/L (ref 0–40)
Albumin/Globulin Ratio: 2 (ref 1.2–2.2)
Albumin: 4.4 g/dL (ref 3.8–4.9)
Alkaline Phosphatase: 88 IU/L (ref 44–121)
BUN/Creatinine Ratio: 18 (ref 9–23)
BUN: 10 mg/dL (ref 6–24)
Bilirubin Total: 0.4 mg/dL (ref 0.0–1.2)
CO2: 22 mmol/L (ref 20–29)
Calcium: 9.1 mg/dL (ref 8.7–10.2)
Chloride: 101 mmol/L (ref 96–106)
Creatinine, Ser: 0.57 mg/dL (ref 0.57–1.00)
Globulin, Total: 2.2 g/dL (ref 1.5–4.5)
Glucose: 83 mg/dL (ref 65–99)
Potassium: 4.2 mmol/L (ref 3.5–5.2)
Sodium: 138 mmol/L (ref 134–144)
Total Protein: 6.6 g/dL (ref 6.0–8.5)
eGFR: 108 mL/min/{1.73_m2} (ref >59)

## 2021-03-02 LAB — CBC WITH DIFFERENTIAL/PLATELET
Basophils Absolute: 0 10*3/uL (ref 0.0–0.2)
Basos: 0 %
EOS (ABSOLUTE): 0.1 10*3/uL (ref 0.0–0.4)
Eos: 1 %
Hematocrit: 42.1 % (ref 34.0–46.6)
Hemoglobin: 14.1 g/dL (ref 11.1–15.9)
Immature Grans (Abs): 0 10*3/uL (ref 0.0–0.1)
Lymphocytes Absolute: 2.2 10*3/uL (ref 0.7–3.1)
Lymphs: 31 %
MCH: 32 pg (ref 26.6–33.0)
MCHC: 33.5 g/dL (ref 31.5–35.7)
MCV: 96 fL (ref 79–97)
Monocytes Absolute: 0.7 10*3/uL (ref 0.1–0.9)
Monocytes: 9 %
Neutrophils Absolute: 4.2 10*3/uL (ref 1.4–7.0)
Platelets: 243 10*3/uL (ref 150–450)
RBC: 4.41 x10E6/uL (ref 3.77–5.28)
RDW: 11.5 % — ABNORMAL LOW (ref 11.7–15.4)
WBC: 7.2 10*3/uL (ref 3.4–10.8)

## 2021-03-02 LAB — VITAMIN D 25 HYDROXY (VIT D DEFICIENCY, FRACTURES): Vit D, 25-Hydroxy: 32.7 ng/mL (ref 30.0–100.0)

## 2021-03-06 ENCOUNTER — Telehealth: Payer: Self-pay

## 2021-03-06 NOTE — Telephone Encounter (Signed)
P.A. OXYBUTYNIN completed

## 2021-03-12 NOTE — Telephone Encounter (Signed)
P.A. Edyth Gunnels not Oxybutynin she has already tried Oxybutynin

## 2021-03-22 ENCOUNTER — Other Ambulatory Visit: Payer: Self-pay | Admitting: Obstetrics and Gynecology

## 2021-04-07 ENCOUNTER — Telehealth: Payer: Self-pay

## 2021-04-07 DIAGNOSIS — N3281 Overactive bladder: Secondary | ICD-10-CM

## 2021-04-07 NOTE — Telephone Encounter (Signed)
P.A. Edyth Gunnels denied, I called plan t# [phone] for explanation and had them fax denial and is plan exclusion.  Preferred meds are Oxybutynin which pt is still using but wants to switch due to side effects and other preferred is Solifenacin (Vesicare).  Called pt and she would like to try the Vesicare.  [facility] if this is ok.

## 2021-04-08 MED ORDER — SOLIFENACIN SUCCINATE 5 MG PO TABS: ORAL_TABLET | ORAL | 1 refills | Status: DC

## 2021-04-08 NOTE — Telephone Encounter (Signed)
Advise pt I sent in vesicare 5mg  [facility].  This is the lower dose . Start at the lower dose, to see if it is effective, as it will have fewer side effects at the lower dose.  If it isn't helping after a week, she can increase to taking 2 tablets.  If this is effective and tolerated, she can let us know, and we can then send in a 90 day prescription for the 10 mg tablets.

## 2021-04-08 NOTE — Telephone Encounter (Signed)
Called pharmacy went thru for $46, called pt and informed

## 2021-04-09 ENCOUNTER — Other Ambulatory Visit: Payer: Self-pay

## 2021-04-09 ENCOUNTER — Ambulatory Visit (AMBULATORY_SURGERY_CENTER): Payer: 59 | Admitting: *Deleted

## 2021-04-09 VITALS — Ht 64.0 in | Wt 209.0 lb

## 2021-04-09 DIAGNOSIS — Z86010 Personal history of colonic polyps: Secondary | ICD-10-CM

## 2021-04-09 NOTE — Progress Notes (Signed)

## 2021-04-18 ENCOUNTER — Other Ambulatory Visit: Payer: Self-pay | Admitting: Family Medicine

## 2021-04-18 DIAGNOSIS — N3281 Overactive bladder: Secondary | ICD-10-CM

## 2021-04-23 ENCOUNTER — Encounter: Payer: Self-pay | Admitting: Internal Medicine

## 2021-04-23 ENCOUNTER — Ambulatory Visit (AMBULATORY_SURGERY_CENTER): Payer: 59 | Admitting: Internal Medicine

## 2021-04-23 ENCOUNTER — Other Ambulatory Visit: Payer: Self-pay

## 2021-04-23 VITALS — BP 116/84 | HR 72 | Temp 98.6°F | Resp 22 | Ht 64.0 in | Wt 209.0 lb

## 2021-04-23 DIAGNOSIS — D128 Benign neoplasm of rectum: Secondary | ICD-10-CM

## 2021-04-23 DIAGNOSIS — Z86010 Personal history of colonic polyps: Secondary | ICD-10-CM

## 2021-04-23 DIAGNOSIS — D129 Benign neoplasm of anus and anal canal: Secondary | ICD-10-CM

## 2021-04-23 DIAGNOSIS — D123 Benign neoplasm of transverse colon: Secondary | ICD-10-CM

## 2021-04-23 MED ORDER — SODIUM CHLORIDE 0.9 % IV SOLN: 500.0000 mL | Freq: Once | INTRAVENOUS | Status: DC

## 2021-04-23 NOTE — Progress Notes (Signed)
Called to room to assist during endoscopic procedure.  Patient ID and intended procedure confirmed with present staff. Received instructions for my participation in the procedure from the performing physician.  

## 2021-04-23 NOTE — Progress Notes (Deleted)
[facility]  Called to room to assist during endoscopic procedure.  Patient ID and intended procedure confirmed with present staff. Received instructions for my participation in the procedure from the performing physician.

## 2021-04-23 NOTE — Progress Notes (Signed)
Pt states all belongings with them at D/C.  None left in the RR

## 2021-04-23 NOTE — Progress Notes (Signed)
pt tolerated well. VSS. awake and to recovery. Report given to RN.  

## 2021-04-23 NOTE — Patient Instructions (Addendum)
I found and removed 4 polyps today. I will let you know pathology results and when to have another routine colonoscopy by mail and/or My Chart.  I appreciate the opportunity to care for you. Gatha Mayer, MD, [facility]  Continue your normal medications  Please read over handout about polyps   YOU HAD AN ENDOSCOPIC PROCEDURE TODAY AT [facility]:   Refer to the procedure report that was given to you for any specific questions about what was found during the examination.  If the procedure report does not answer your questions, please call your gastroenterologist to clarify.  If you requested that your care partner not be given the details of your procedure findings, then the procedure report has been included in a sealed envelope for you to review at your convenience later.  YOU SHOULD EXPECT: Some feelings of bloating in the abdomen. Passage of more gas than usual.  Walking can help get rid of the air that was put into your GI tract during the procedure and reduce the bloating. If you had a lower endoscopy (such as a colonoscopy or flexible sigmoidoscopy) you may notice spotting of blood in your stool or on the toilet paper. If you underwent a bowel prep for your procedure, you may not have a normal bowel movement for a few days.  Please Note:  You might notice some irritation and congestion in your nose or some drainage.  This is from the oxygen used during your procedure.  There is no need for concern and it should clear up in a day or so.  SYMPTOMS TO REPORT IMMEDIATELY:  Following lower endoscopy (colonoscopy or flexible sigmoidoscopy):  Excessive amounts of blood in the stool  Significant tenderness or worsening of abdominal pains  Swelling of the abdomen that is new, acute  Fever of 100F or higher  For urgent or emergent issues, a gastroenterologist can be reached at any hour by calling [phone]. Do not use MyChart messaging for urgent concerns.    DIET:  We do  recommend a small meal at first, but then you may proceed to your regular diet.  Drink plenty of fluids but you should avoid alcoholic beverages for 24 hours.  ACTIVITY:  You should plan to take it easy for the rest of today and you should NOT DRIVE or use heavy machinery until tomorrow (because of the sedation medicines used during the test).    FOLLOW UP: Our staff will call the number listed on your records 48-72 hours following your procedure to check on you and address any questions or concerns that you may have regarding the information given to you following your procedure. If we do not reach you, we will leave a message.  We will attempt to reach you two times.  During this call, we will ask if you have developed any symptoms of COVID 19. If you develop any symptoms (ie: fever, flu-like symptoms, shortness of breath, cough etc.) before then, please call [phone].  If you test positive for Covid 19 in the 2 weeks post procedure, please call and report this information to [place].    If any biopsies were taken you will be contacted by phone or by letter within the next 1-3 weeks.  Please call us at [phone] if you have not heard about the biopsies in 3 weeks.    SIGNATURES/CONFIDENTIALITY: You and/or your care partner have signed paperwork which will be entered into your electronic medical record.  These signatures attest to  the fact that that the information above on your After Visit Summary has been reviewed and is understood.  Full responsibility of the confidentiality of this discharge information lies with you and/or your care-partner.

## 2021-04-23 NOTE — Op Note (Signed)
[facility] Patient Name: Jane Valdez Procedure Date: 04/23/2021 8:56 AM MRN: [id number] Endoscopist: Gatha Mayer , MD Age: 55 Referring MD:  Date of Birth: [birth year] Gender: Female Account #: [id number] Procedure:                Colonoscopy Indications:              Surveillance: Personal history of adenomatous                            polyps on last colonoscopy > 3 years ago Medicines:                Propofol per Anesthesia, Monitored Anesthesia Care Procedure:                Pre-Anesthesia Assessment:                           - Prior to the procedure, a History and Physical                            was performed, and patient medications and                            allergies were reviewed. The patient's tolerance of                            previous anesthesia was also reviewed. The risks                            and benefits of the procedure and the sedation                            options and risks were discussed with the patient.                            All questions were answered, and informed consent                            was obtained. Prior Anticoagulants: The patient has                            taken no previous anticoagulant or antiplatelet                            agents. ASA Grade Assessment: II - A patient with                            mild systemic disease. After reviewing the risks                            and benefits, the patient was deemed in                            satisfactory condition to undergo the procedure.  After obtaining informed consent, the colonoscope                            was passed under direct vision. Throughout the                            procedure, the patient's blood pressure, pulse, and                            oxygen saturations were monitored continuously. The                            Olympus [id number]-[id number] (#[id number]) Colonoscope was                             introduced through the anus and advanced to the the                            cecum, identified by appendiceal orifice and                            ileocecal valve. The colonoscopy was performed                            without difficulty. The patient tolerated the                            procedure well. The quality of the bowel                            preparation was good. The ileocecal valve,                            appendiceal orifice, and rectum were photographed.                            The bowel preparation used was Miralax via split                            dose instruction. Scope In: 9:10:48 AM Scope Out: 9:33:54 AM Scope Withdrawal Time: 0 hours 20 minutes 14 seconds  Total Procedure Duration: 0 hours 23 minutes 6 seconds  Findings:                 The perianal and digital rectal examinations were                            normal.                           Four flat and sessile polyps were found in the                            sigmoid colon and transverse colon. The polyps were  2 to 7 mm in size. These polyps were removed with a                            cold snare. Resection and retrieval were complete.                            Verification of patient identification for the                            specimen was done. Estimated blood loss was minimal.                           The exam was otherwise without abnormality on                            direct and retroflexion views. Complications:            No immediate complications. Estimated Blood Loss:     Estimated blood loss was minimal. Impression:               - Four 2 to 7 mm polyps in the sigmoid colon and in                            the transverse colon, removed with a cold snare.                            Resected and retrieved.                           - The examination was otherwise normal on direct                            and retroflexion views.                            - Personal history of colonic polyp 10 mm adenoma                            2018. Recommendation:           - Patient has a contact number available for                            emergencies. The signs and symptoms of potential                            delayed complications were discussed with the                            patient. Return to normal activities tomorrow.                            Written discharge instructions were provided to the  patient.                           - Resume previous diet.                           - Repeat colonoscopy is recommended for                            surveillance. The colonoscopy date will be                            determined after pathology results from today's                            exam become available for review. Gatha Mayer, MD 04/23/2021 9:49:19 AM This report has been signed electronically.

## 2021-04-28 ENCOUNTER — Telehealth: Payer: Self-pay | Admitting: *Deleted

## 2021-04-28 NOTE — Telephone Encounter (Signed)
  Follow up Call-  Call back number 04/23/2021  Post procedure Call Back phone  # [phone]  Permission to leave phone message Yes  Some recent data might be hidden     Patient questions:  Do you have a fever, pain , or abdominal swelling? No. Pain Score  0 *  Have you tolerated food without any problems? Yes.    Have you been able to return to your normal activities? Yes.    Do you have any questions about your discharge instructions: Diet   No. Medications  No. Follow up visit  No.  Do you have questions or concerns about your Care? No.  Actions: * If pain score is 4 or above: No action needed, pain <4.  Have you developed a fever since your procedure? no  2.   Have you had an respiratory symptoms (SOB or cough) since your procedure? no  3.   Have you tested positive for COVID 19 since your procedure no  4.   Have you had any family members/close contacts diagnosed with the COVID 19 since your procedure?  no   If yes to any of these questions please route to Joylene John, RN and Joella Prince, RN

## 2021-04-29 ENCOUNTER — Other Ambulatory Visit: Payer: Self-pay | Admitting: Family Medicine

## 2021-04-29 DIAGNOSIS — N3281 Overactive bladder: Secondary | ICD-10-CM

## 2021-05-03 ENCOUNTER — Other Ambulatory Visit: Payer: Self-pay

## 2021-05-03 ENCOUNTER — Other Ambulatory Visit (INDEPENDENT_AMBULATORY_CARE_PROVIDER_SITE_OTHER): Payer: 59

## 2021-05-03 DIAGNOSIS — Z23 Encounter for immunization: Secondary | ICD-10-CM

## 2021-05-13 ENCOUNTER — Encounter: Payer: Self-pay | Admitting: Internal Medicine

## 2021-05-17 ENCOUNTER — Encounter: Payer: Self-pay | Admitting: Family Medicine

## 2021-05-17 DIAGNOSIS — N3281 Overactive bladder: Secondary | ICD-10-CM

## 2021-05-17 MED ORDER — SOLIFENACIN SUCCINATE 10 MG PO TABS
10.0000 mg | ORAL_TABLET | Freq: Every day | ORAL | 3 refills | Status: DC
Start: 2021-05-17 — End: 2022-04-19

## 2021-07-21 ENCOUNTER — Encounter: Payer: Self-pay | Admitting: *Deleted

## 2021-10-12 ENCOUNTER — Telehealth: Payer: 59 | Admitting: Family Medicine

## 2021-10-12 DIAGNOSIS — B9689 Other specified bacterial agents as the cause of diseases classified elsewhere: Secondary | ICD-10-CM

## 2021-10-12 DIAGNOSIS — J019 Acute sinusitis, unspecified: Secondary | ICD-10-CM | POA: Diagnosis not present

## 2021-10-12 NOTE — Progress Notes (Signed)
E-Visit for Sinus Problems  It appears that you developed a secondary infection after having COVID.   We are sorry that you are not feeling well.  Here is how we plan to help!  Based on what you have shared with me it looks like you have sinusitis.  Sinusitis is inflammation and infection in the sinus cavities of the head.  Based on your presentation I believe you most likely have Acute Bacterial Sinusitis.  This is an infection caused by bacteria and is treated with antibiotics. I have prescribed Augmentin 875mg /125mg  one tablet twice daily with food, for 7 days. You may use an oral decongestant such as Mucinex D or if you have glaucoma or high blood pressure use plain Mucinex. Saline nasal spray help and can safely be used as often as needed for congestion.  If you develop worsening sinus pain, fever or notice severe headache and vision changes, or if symptoms are not better after completion of antibiotic, please schedule an appointment with a health care provider.    Sinus infections are not as easily transmitted as other respiratory infection, however we still recommend that you avoid close contact with loved ones, especially the very young and elderly.  Remember to wash your hands thoroughly throughout the day as this is the number one way to prevent the spread of infection!  Home Care: Only take medications as instructed by your medical team. Complete the entire course of an antibiotic. Do not take these medications with alcohol. A steam or ultrasonic humidifier can help congestion.  You can place a towel over your head and breathe in the steam from hot water coming from a faucet. Avoid close contacts especially the very young and the elderly. Cover your mouth when you cough or sneeze. Always remember to wash your hands.  Get Help Right Away If: You develop worsening fever or sinus pain. You develop a severe head ache or visual changes. Your symptoms persist after you have completed your  treatment plan.  Make sure you Understand these instructions. Will watch your condition. Will get help right away if you are not doing well or get worse.  Thank you for choosing an e-visit.  Your e-visit answers were reviewed by a board certified advanced clinical practitioner to complete your personal care plan. Depending upon the condition, your plan could have included both over the counter or prescription medications.  Please review your pharmacy choice. Make sure the pharmacy is open so you can pick up prescription now. If there is a problem, you may contact your provider through [employer] and have the prescription routed to another pharmacy.  Your safety is important to [place]. If you have drug allergies check your prescription carefully.   For the next 24 hours you can use MyChart to ask questions about today's visit, request a non-urgent call back, or ask for a work or school excuse. You will get an email in the next two days asking about your experience. I hope that your e-visit has been valuable and will speed your recovery.  I provided 5 minutes of non face-to-face time during this encounter for chart review, medication and order placement, as well as and documentation.

## 2021-10-13 MED ORDER — AMOXICILLIN-POT CLAVULANATE 875-125 MG PO TABS: 1.0000 | ORAL_TABLET | Freq: Two times a day (BID) | ORAL | 0 refills | Status: DC

## 2021-10-13 NOTE — Addendum Note (Signed)
Addended by: Brunetta Jeans on: 10/13/2021 10:47 AM   Modules accepted: Orders

## 2022-01-10 ENCOUNTER — Other Ambulatory Visit: Payer: Self-pay | Admitting: Family Medicine

## 2022-01-10 DIAGNOSIS — Z1231 Encounter for screening mammogram for malignant neoplasm of breast: Secondary | ICD-10-CM

## 2022-01-13 ENCOUNTER — Ambulatory Visit
Admission: RE | Admit: 2022-01-13 | Discharge: 2022-01-13 | Disposition: A | Discharge status: Home / Self Care | Payer: 59 | Source: Ambulatory Visit | Attending: Family Medicine | Admitting: Family Medicine

## 2022-01-13 DIAGNOSIS — Z1231 Encounter for screening mammogram for malignant neoplasm of breast: Secondary | ICD-10-CM

## 2022-01-25 ENCOUNTER — Ambulatory Visit: Payer: 59 | Admitting: Physician Assistant

## 2022-01-25 ENCOUNTER — Encounter: Payer: Self-pay | Admitting: Physician Assistant

## 2022-01-25 VITALS — BP 160/90 | HR 77 | Ht 64.0 in | Wt 213.0 lb

## 2022-01-25 DIAGNOSIS — Z8249 Family history of ischemic heart disease and other diseases of the circulatory system: Secondary | ICD-10-CM | POA: Diagnosis not present

## 2022-01-25 DIAGNOSIS — G4489 Other headache syndrome: Secondary | ICD-10-CM | POA: Diagnosis not present

## 2022-01-25 DIAGNOSIS — I10 Essential (primary) hypertension: Secondary | ICD-10-CM | POA: Diagnosis not present

## 2022-01-25 DIAGNOSIS — Z6836 Body mass index (BMI) 36.0-36.9, adult: Secondary | ICD-10-CM

## 2022-01-25 DIAGNOSIS — Z566 Other physical and mental strain related to work: Secondary | ICD-10-CM

## 2022-01-25 MED ORDER — AMLODIPINE BESYLATE 5 MG PO TABS: 5.0000 mg | ORAL_TABLET | Freq: Every day | ORAL | 3 refills | Status: DC

## 2022-01-25 MED ORDER — BUSPIRONE HCL 5 MG PO TABS: 5.0000 mg | ORAL_TABLET | Freq: Two times a day (BID) | ORAL | 3 refills | Status: DC

## 2022-01-25 NOTE — Patient Instructions (Addendum)
Eat a low salt diet and minimize processed foods (examples canned vegetables, lunch meats, fast food), drink 8 glasses of water a day, exercise 3 - 5 times a week (example walking 30 minutes daily), decrease alcohol intake, decrease smoking or nicotine use, decrease caffeine intake, check blood pressure at home if you have a machine. ? ?You can take OTC Tylenol for headaches. ? ?

## 2022-01-25 NOTE — Assessment & Plan Note (Signed)
New diagnosis, start amlodipine 5 mg daily, eat a low salt diet, do not add any salt to food when cooking, avoid processed foods, avoid fried foods ? ?

## 2022-01-25 NOTE — Progress Notes (Addendum)
? ?Acute Office Visit ? ?Subjective:  ? ? Patient ID: Jane Valdez, female    DOB: 11-08-1965, 56 y.o.   MRN: [id number] ? ?Chief Complaint  ?Patient presents with  ? Acute Visit  ?  Blood pressure high 154/96,148/94,146/95,150/90 were her readings from today  and anxiety and headaches due to work.  ? ? ?HPI ?Patient is in today for elevated blood pressure and stress from work; light-headed, ha, not sleeping well; hasn't taken any Tylenol for HA today; denies chest pain, shortness of breath, palpitations, or headache; last had heartbur last week and took an antacid helped; is hoping to trnsfer within the [facility]; has 1 - 2 cups of coffee in the morning; drinks about 2 bottles of water a day; sometimes adds salt to food and limits ? ? ?Outpatient Medications Prior to Visit  ?Medication Sig Dispense Refill  ? Melatonin 10 MG TABS Take 10 mg by mouth at bedtime as needed (sleep).    ? Multiple Vitamin (MULTIVITAMIN PO) Take 2 each by mouth daily.    ? norethindrone (MICRONOR,CAMILA,ERRIN) 0.35 MG tablet Take 1 tablet by mouth every evening.     ? solifenacin (VESICARE) 10 MG tablet Take 1 tablet (10 mg total) by mouth daily. 90 tablet 3  ? traZODone (DESYREL) 100 MG tablet TAKE 1 AND 1/2 TABLETS BY MOUTH AT BEDTIME AS NEEDED FOR SLEEP. NEEDS APPT FOR REFILLS (Patient not taking: Reported on 01/25/2022) 135 tablet 6  ? amoxicillin-clavulanate (AUGMENTIN) 875-125 MG tablet Take 1 tablet by mouth 2 (two) times daily. (Patient not taking: Reported on 01/25/2022) 14 tablet 0  ? ?No facility-administered medications prior to visit.  ? ? ?No Known Allergies ? ?Review of Systems  ?Constitutional:  Negative for activity change and chills.  ?HENT:  Negative for congestion and voice change.   ?Eyes:  Negative for pain and redness.  ?Respiratory:  Negative for cough and wheezing.   ?Cardiovascular:  Negative for chest pain.  ?Gastrointestinal:  Negative for constipation, diarrhea, nausea and vomiting.  ?Endocrine: Negative  for polyuria.  ?Genitourinary:  Negative for flank pain.  ?Skin:  Negative for color change and rash.  ?Allergic/Immunologic: Negative for immunocompromised state.  ?Neurological:  Negative for dizziness.  ?Psychiatric/Behavioral:  Positive for sleep disturbance. Negative for agitation, dysphoric mood and suicidal ideas. The patient is nervous/anxious.   ? ?   ?Objective:  ?  ?Physical Exam ?Vitals and nursing note reviewed.  ?Constitutional:   ?   General: She is not in acute distress. ?   Appearance: Normal appearance. She is not ill-appearing.  ?HENT:  ?   Head: Normocephalic and atraumatic.  ?   Right Ear: External ear normal.  ?   Left Ear: External ear normal.  ?   Nose: No congestion.  ?Eyes:  ?   Extraocular Movements: Extraocular movements intact.  ?   Conjunctiva/sclera: Conjunctivae normal.  ?   Pupils: Pupils are equal, round, and reactive to light.  ?Cardiovascular:  ?   Rate and Rhythm: Normal rate and regular rhythm.  ?   Pulses: Normal pulses.  ?   Heart sounds: Normal heart sounds.  ?Pulmonary:  ?   Effort: Pulmonary effort is normal.  ?   Breath sounds: Normal breath sounds. No wheezing.  ?Abdominal:  ?   General: Bowel sounds are normal.  ?   Palpations: Abdomen is soft.  ?Musculoskeletal:     ?   General: Normal range of motion.  ?   Cervical back: Normal range of  motion and neck supple.  ?   Right lower leg: No edema.  ?   Left lower leg: No edema.  ?Skin: ?   General: Skin is warm and dry.  ?   Findings: No bruising.  ?Neurological:  ?   General: No focal deficit present.  ?   Mental Status: She is alert and oriented to person, place, and time.  ?Psychiatric:     ?   Mood and Affect: Mood normal.     ?   Behavior: Behavior normal.     ?   Thought Content: Thought content normal.  ? ? ?BP (!) 160/90   Pulse 77   Ht _0  (1.626 m)   Wt 213 lb (96.6 kg)   SpO2 98%   BMI 36.56 kg/m?  ? ?Wt Readings from Last 3 Encounters:  ?01/25/22 213 lb (96.6 kg)  ?04/23/21 209 lb (94.8 kg)  ?04/09/21 209  lb (94.8 kg)  ? ?BP Readings from Last 10 Encounters:  ?01/25/22 (!) 160/90  ?04/23/21 116/84  ?03/01/21 132/84  ?01/28/21 130/78  ?04/09/20 120/82  ?02/27/20 116/72  ?02/12/20 118/70  ?09/10/19 122/90  ?04/25/19 122/84  ?11/21/18 122/64  ? ? ? ?Results for orders placed or performed in visit on 03/01/21  ?CBC with Differential/Platelet  ?Result Value Ref Range  ? WBC 7.2 3.4 - 10.8 x10E3/uL  ? RBC 4.41 3.77 - 5.28 x10E6/uL  ? Hemoglobin 14.1 11.1 - 15.9 g/dL  ? Hematocrit 42.1 34.0 - 46.6 %  ? MCV 96 79 - 97 fL  ? MCH 32.0 26.6 - 33.0 pg  ? MCHC 33.5 31.5 - 35.7 g/dL  ? RDW 11.5 (L) 11.7 - 15.4 %  ? Platelets 243 150 - 450 x10E3/uL  ? Neutrophils 59 Not Estab. %  ? Lymphs 31 Not Estab. %  ? Monocytes 9 Not Estab. %  ? Eos 1 Not Estab. %  ? Basos 0 Not Estab. %  ? Neutrophils Absolute 4.2 1.4 - 7.0 x10E3/uL  ? Lymphocytes Absolute 2.2 0.7 - 3.1 x10E3/uL  ? Monocytes Absolute 0.7 0.1 - 0.9 x10E3/uL  ? EOS (ABSOLUTE) 0.1 0.0 - 0.4 x10E3/uL  ? Basophils Absolute 0.0 0.0 - 0.2 x10E3/uL  ? Immature Granulocytes 0 Not Estab. %  ? Immature Grans (Abs) 0.0 0.0 - 0.1 x10E3/uL  ?Comprehensive metabolic panel  ?Result Value Ref Range  ? Glucose 83 65 - 99 mg/dL  ? BUN 10 6 - 24 mg/dL  ? Creatinine, Ser 0.57 0.57 - 1.00 mg/dL  ? eGFR 108 >59 mL/min/1.73  ? BUN/Creatinine Ratio 18 9 - 23  ? Sodium 138 134 - 144 mmol/L  ? Potassium 4.2 3.5 - 5.2 mmol/L  ? Chloride 101 96 - 106 mmol/L  ? CO2 22 20 - 29 mmol/L  ? Calcium 9.1 8.7 - 10.2 mg/dL  ? Total Protein 6.6 6.0 - 8.5 g/dL  ? Albumin 4.4 3.8 - 4.9 g/dL  ? Globulin, Total 2.2 1.5 - 4.5 g/dL  ? Albumin/Globulin Ratio 2.0 1.2 - 2.2  ? Bilirubin Total 0.4 0.0 - 1.2 mg/dL  ? Alkaline Phosphatase 88 44 - 121 IU/L  ? AST 18 0 - 40 IU/L  ? ALT 16 0 - 32 IU/L  ?Lipid panel  ?Result Value Ref Range  ? Cholesterol, Total 172 100 - 199 mg/dL  ? Triglycerides 86 0 - 149 mg/dL  ? HDL 54 >39 mg/dL  ? VLDL Cholesterol Cal 16 5 - 40 mg/dL  ? LDL Chol Calc (NIH) 102 (  H) 0 - 99 mg/dL  ? Chol/HDL  Ratio 3.2 0.0 - 4.4 ratio  ?VITAMIN D 25 Hydroxy (Vit-D Deficiency, Fractures)  ?Result Value Ref Range  ? Vit D, 25-Hydroxy 32.7 30.0 - 100.0 ng/mL  ?POCT Urinalysis DIP (Proadvantage Device)  ?Result Value Ref Range  ? Color, UA yellow yellow  ? Clarity, UA clear clear  ? Glucose, UA negative negative mg/dL  ? Bilirubin, UA negative negative  ? Ketones, POC UA negative negative mg/dL  ? Specific Gravity, Urine 1.010   ? Blood, UA negative negative  ? pH, UA 8.0 5.0 - 8.0  ? Protein Ur, POC trace (A) negative mg/dL  ? Urobilinogen, Ur neg   ? Nitrite, UA Negative Negative  ? Leukocytes, UA Trace (A) Negative  ? ? ?   ?Assessment & Plan:  ?1. Primary hypertension ?- CBC with Differential/Platelet ?- Comprehensive metabolic panel ?- TSH + free T4 ?-  new diagnosis, Eat a low salt diet and minimize processed foods (examples canned vegetables, lunch meats, fast food), drink 8 glasses of water a day, exercise 3 - 5 times a week (example walking 30 minutes daily), decrease alcohol intake, decrease smoking or nicotine use, decrease caffeine intake, check blood pressure at home if you have a machine; start amlodipine 5 mg daily, keep follow up appointment in 1 month ? ? ?2. Other headache syndrome ?- CBC with Differential/Platelet ?- Comprehensive metabolic panel ?- TSH + free T4 ?- OTC Tylenol as needed ? ?3. Stress at work ?- Comprehensive metabolic panel ?- TSH + free T4 ?- Buspar 5 mg bid, can seek therapist prn ? ?4. Family history of hypertension ? ?Please call the office for a follow up appointment if available, otherwise go to Urgent Care, call 911 / EMS, or go to the Emergency Department for further evaluation if symptoms are worse.  ? ? ?Meds ordered this encounter  ?Medications  ? amLODipine (NORVASC) 5 MG tablet  ?  Sig: Take 1 tablet (5 mg total) by mouth daily.  ?  Dispense:  30 tablet  ?  Refill:  3  ?  Order Specific Question:   Supervising Provider  ?  Answer:   Denita Lung [[id number]]  ? busPIRone (BUSPAR)  5 MG tablet  ?  Sig: Take 1 tablet (5 mg total) by mouth 2 (two) times daily.  ?  Dispense:  60 tablet  ?  Refill:  3  ?  Order Specific Question:   Supervising Provider  ?  Answer:   Denita Lung [phone]

## 2022-01-26 LAB — COMPREHENSIVE METABOLIC PANEL
ALT: 20 IU/L (ref 0–32)
AST: 19 IU/L (ref 0–40)
Albumin/Globulin Ratio: 1.8 (ref 1.2–2.2)
Albumin: 4.4 g/dL (ref 3.8–4.9)
Alkaline Phosphatase: 94 IU/L (ref 44–121)
BUN/Creatinine Ratio: 16 (ref 9–23)
BUN: 11 mg/dL (ref 6–24)
Bilirubin Total: 0.5 mg/dL (ref 0.0–1.2)
CO2: 23 mmol/L (ref 20–29)
Calcium: 9.5 mg/dL (ref 8.7–10.2)
Chloride: 104 mmol/L (ref 96–106)
Creatinine, Ser: 0.68 mg/dL (ref 0.57–1.00)
Globulin, Total: 2.4 g/dL (ref 1.5–4.5)
Glucose: 84 mg/dL (ref 70–99)
Potassium: 4.4 mmol/L (ref 3.5–5.2)
Sodium: 142 mmol/L (ref 134–144)
Total Protein: 6.8 g/dL (ref 6.0–8.5)
eGFR: 103 mL/min/{1.73_m2} (ref >59)

## 2022-01-26 LAB — CBC WITH DIFFERENTIAL/PLATELET
Basophils Absolute: 0 10*3/uL (ref 0.0–0.2)
Basos: 1 %
EOS (ABSOLUTE): 0.1 10*3/uL (ref 0.0–0.4)
Eos: 1 %
Hematocrit: 45.7 % (ref 34.0–46.6)
Hemoglobin: 15 g/dL (ref 11.1–15.9)
Immature Grans (Abs): 0 10*3/uL (ref 0.0–0.1)
Immature Granulocytes: 0 %
Lymphocytes Absolute: 2.2 10*3/uL (ref 0.7–3.1)
Lymphs: 30 %
MCH: 32.5 pg (ref 26.6–33.0)
MCHC: 32.8 g/dL (ref 31.5–35.7)
MCV: 99 fL — ABNORMAL HIGH (ref 79–97)
Monocytes Absolute: 0.7 10*3/uL (ref 0.1–0.9)
Monocytes: 10 %
Neutrophils Absolute: 4.2 10*3/uL (ref 1.4–7.0)
Neutrophils: 58 %
Platelets: 246 10*3/uL (ref 150–450)
RBC: 4.61 x10E6/uL (ref 3.77–5.28)
RDW: 11.5 % — ABNORMAL LOW (ref 11.7–15.4)
WBC: 7.3 10*3/uL (ref 3.4–10.8)

## 2022-01-26 LAB — TSH+FREE T4
Free T4: 1.37 ng/dL (ref 0.82–1.77)
TSH: 1.21 u[IU]/mL (ref 0.450–4.500)

## 2022-02-16 ENCOUNTER — Other Ambulatory Visit: Payer: Self-pay | Admitting: Physician Assistant

## 2022-03-02 NOTE — Patient Instructions (Addendum)
?HEALTH MAINTENANCE RECOMMENDATIONS: ? ?It is recommended that you get at least 30 minutes of aerobic exercise at least 5 days/week (for weight loss, you may need as much as 60-90 minutes). This can be any activity that gets your heart rate up. This can be divided in 10-15 minute intervals if needed, but try and build up your endurance at least once a week.  Weight bearing exercise is also recommended twice weekly. ? ?Eat a healthy diet with lots of vegetables, fruits and fiber.  "Colorful" foods have a lot of vitamins (ie green vegetables, tomatoes, red peppers, etc).  Limit sweet tea, regular sodas and alcoholic beverages, all of which has a lot of calories and sugar.  Up to 1 alcoholic drink daily may be beneficial for women (unless trying to lose weight, watch sugars).  Drink a lot of water. ? ?Calcium recommendations are 1200-1500 mg daily (1500 mg for postmenopausal women or women without ovaries), and vitamin D 1000 IU daily.  This should be obtained from diet and/or supplements (vitamins), and calcium should not be taken all at once, but in divided doses. ? ?Monthly self breast exams and yearly mammograms for women over the age of 16 is recommended. ? ?Sunscreen of at least SPF 30 should be used on all sun-exposed parts of the skin when outside between the hours of 10 am and 4 pm (not just when at beach or pool, but even with exercise, golf, tennis, and yard work!)  Use a sunscreen that says "broad spectrum" so it covers both UVA and UVB rays, and make sure to reapply every 1-2 hours. ? ?Remember to change the batteries in your smoke detectors when changing your clock times in the spring and fall. Carbon monoxide detectors are recommended for your home. ? ?Use your seat belt every time you are in a car, and please drive safely and not be distracted with cell phones and texting while driving. ? ?Please try and quit smoking--start thinking about why/when you smoke (habit, boredom, stress) in order to come up  with effective strategies to cut back or quit. Available resources to help you quit include free counseling through [facility] Quitline ([place]uit[facility] or 1-800-QUITNOW). Many insurance companies also have smoking cessation programs (which may decrease the cost of patches, meds if enrolled).  ? ?You may try increasing the buspar to 1.5 tablets twice daily (vs 1 pill three times daily), if your stress/anxiety isn't adequately controlled on the twice daily '5mg'$  dose.  If/when you are running low, let me know how much you're taking so that I can refill the appropriate dose/quantity. ?If you need to further increase the dose beyond 7.'5mg'$  twice daily, it would be '10mg'$  in the morning and 7.'5mg'$  in the evening for a week, and then further up to '10mg'$  twice daily, only if needed. ? ?Please read the information regarding sodium in the diet, as we discussed. ? ?Increasing the evening dose of buspar might help with your sleep issues (mind keeping you awake). Pain may contribute to your awakenings--try tylenol arthritis prior to bedtime (this is longer-acting than plain tylenol). ?You can re-try the trazodone at bedtime (safe with your current medications). ? ?Please schedule a routine eye exam. ? ?Ask your husband about pauses in your breathing.  If you continue to wake up feeling unrefreshed (when you had a good night sleep), and/or if you have persistent tiredness during the day, this is a reason to have a sleep study to look for sleep apnea. Try and avoid sleeping on  your back, and weight loss should also help. ? ?Please cut back on alcohol--this should help your sleep, maybe not smoke as much (since you do them together), and help with weight loss.  If you do have a glass of wine, it should be 5-6 ounces at most. ? ?I encourage you to work on accountability for your diet/weight loss.  Consider free apps like LoseIt or MyFitnessPal, vs Weight Watchers. ?If you want to discuss medications, check with your insurance regarding coverage  prior to your visit, and set up a separate visit. ? ? ?

## 2022-03-02 NOTE — Progress Notes (Signed)
?Chief Complaint  ?Patient presents with  ? Annual Exam  ?  Annual exam(patient had coffee with creamer) no pap-sees Dr. Harrington Challenger and I will request records. Patient said she is going to schedule an eye exam.  ? ?Jane Valdez is a 56 y.o. female who presents for a complete physical.  She sees Dr. Harrington Challenger for GYN care. ? ?She is complaining of some L-sided neck pain for the last couple of weeks--feels like a "crick" in her neck, hurts to look to the left. She denies radiation of the pain, numbness, tingling. ? ?She saw Sula Soda last month with complaints of elevated blood pressure and stress from work--felt light-headed, having headaches, not sleeping well. She was started on '5mg'$  of amlodipine and Buspar '5mg'$  BID.  ? ?Hypertension:  diagnosed last month when had elevated BP (also associated with stress, poor sleep). Today she reports she hasn't made significant changes to her diet, but doesn't salt food much.  Having some more fresh vegetables rather than canned. ?She didn't bring list of BP's today. Recalls the max of 135/82, more typically running 120's/80's.  Had some lower ones, can't recall the values, but no associated dizziness or symptoms. ?She does sometimes notice some dizziness if gets up from laying down quickly (never with sitting to standing). This is not frequent. ? ?BP Readings from Last 3 Encounters:  ?03/03/22 128/80  ?01/25/22 (!) 160/90  ?04/23/21 116/84  ? ?Stress/anxiety:  She was started on Buspar '5mg'$  BID last month.  Her GAD-7 prior to treatment was 9. PHQ-2 was 0. ?She is seeking new employment.  She has friends to talk to, someone to vent to.  Some things still bother her, but it doesn't upset her as much, not reacting the same way, dealing better. ? ?She has h/o insomnia, and has previously been prescribed Trazodone '100mg'$  qHS.  She hasn't been taking this (was worried about too many medications).  She doesn't know where her bottle is (is old).  ?She has trouble waking up during the night (no  trouble falling asleep).  Sometimes this is due to pain (hip) issues. She is using melatonin gummies without much benefit.  ? ?OAB--She has been taking vesicare '10mg'$  daily. She doesn't have a dry mouth, and seems a little more effective than the oxybutynin.  Denies any incontinence.  Last night she was up 3x to void, but isn't usually up often. ?Previously didn't tolerate oxybutynin '15mg'$  (wasn't completely effective, and had dry mouth and dry eyes).  Myrbetriq was denied by insurance. ? ?Heartburn--Hasn't had any problems lately.  She has Pepcid AC and pepcid complete at home.  Only rare heartburn.  No longer having any discomfort in pain or symptoms at night. ? ?Tobacco use--smokes in the evenings, at least 5/day, more on the weekends because she is home. She is not ready to quit. ? ? ?Immunization History  ?Administered Date(s) Administered  ? Influenza,inj,Quad PF,6+ Mos 07/24/2017  ? Influenza-Unspecified 07/24/2016, 07/24/2018, 08/16/2019  ? Moderna Sars-Covid-2 Vaccination 01/10/2020, 02/11/2020  ? Tdap 01/28/2021  ? Zoster Recombinat (Shingrix) 03/01/2021, 05/03/2021  ? ?Didn't get a flu shot this year.  Doesn't want COVID booster. ?Last Pap smear: 07/2019 by Dr. Harrington Challenger (no results in Epic; 07/12/2018 was prior pap, noted to be normal). Had annual visit 08/2021 ?Last mammogram: 12/2021 ?Last colonoscopy: 04/2021, 4 polyps--adenomatous and sessile serrated; 3 year f/u recommended (Dr. Carlean Purl).  Prior colonoscopy was 09/2017, with a 50m adenomatous colon polyp; ?Last DEXA: never ?Dentist: twice a year ?Ophtho: years ago ?Exercise:  this past weekend she started using her walking tape. Hasn't been getting any regular exercise. ? ?Lipids: ?Lab Results  ?Component Value Date  ? CHOL 172 03/01/2021  ? HDL 54 03/01/2021  ? LDLCALC 102 (H) 03/01/2021  ? TRIG 86 03/01/2021  ? CHOLHDL 3.2 03/01/2021  ? ?Vitamin D-OH 32.7 in 02/2021 ? ? ?PMH, PSH, SH and FH reviewed ? ?Outpatient Encounter Medications as of 03/03/2022   ?Medication Sig Note  ? amLODipine (NORVASC) 5 MG tablet Take 1 tablet (5 mg total) by mouth daily.   ? busPIRone (BUSPAR) 5 MG tablet TAKE 1 TABLET BY MOUTH TWICE A DAY   ? Melatonin 10 MG TABS Take 10 mg by mouth at bedtime as needed (sleep).   ? Multiple Vitamin (MULTIVITAMIN PO) Take 2 each by mouth daily. 01/28/2021: VitaFusion Gummy  ? norethindrone (MICRONOR,CAMILA,ERRIN) 0.35 MG tablet Take 1 tablet by mouth every evening.    ? solifenacin (VESICARE) 10 MG tablet Take 1 tablet (10 mg total) by mouth daily.   ? traZODone (DESYREL) 100 MG tablet Take 1-1.5 tablets (100-150 mg total) by mouth at bedtime as needed for sleep.   ? [DISCONTINUED] traZODone (DESYREL) 100 MG tablet TAKE 1 AND 1/2 TABLETS BY MOUTH AT BEDTIME AS NEEDED FOR SLEEP. NEEDS APPT FOR REFILLS (Patient not taking: Reported on 01/25/2022)   ? ?No facility-administered encounter medications on file as of 03/03/2022.  ? ?NOT taking any trazodone prior to today's visit ? ?No Known Allergies ? ? ?ROS:  The patient denies anorexia, fever, recent weight changes, headaches,  vision changes, decreased hearing, ear pain, sore throat, breast concerns, chest pain, palpitations, syncope, dyspnea on exertion, cough, swelling, nausea, vomiting, diarrhea, constipation, abdominal pain, melena, hematochezia, hematuria, dysuria, vaginal discharge, odor or itch, genital lesions, numbness, tingling, weakness, tremor, suspicious skin lesions, depression, abnormal bleeding/bruising, or enlarged lymph nodes. ?Heartburn rare ?Mild hot flashes, no sweats.  ?She was told she could stop the micronor, but hasn't yet. ?No longer having much spotting. ?Insomnia--per HPI ?Some snoring.  Not aware of apnea (husband hasn't mentioned).  ?Neck pain per HPI ? ? ?PHYSICAL EXAM: ? ?BP 128/80   Pulse 84   Ht '5\' 4"'$  (1.626 m)   Wt 214 lb 12.8 oz (97.4 kg)   BMI 36.87 kg/m?  ? ?Wt Readings from Last 3 Encounters:  ?03/03/22 214 lb 12.8 oz (97.4 kg)  ?01/25/22 213 lb (96.6 kg)   ?04/23/21 209 lb (94.8 kg)  ? ? ?General Appearance:    Alert, cooperative, no distress, appears stated age. Not changed into gown.  ?Head:    Normocephalic, without obvious abnormality, atraumatic  ?Eyes:    PERRL, conjunctiva/corneas clear, EOM's intact, fundi  ?  benign  ?Ears:    Normal TM's and external ear canals  ?Nose:   Normal, no drainage  ?Throat:   Normal mucosa, no lesions  ?Neck:   Supple, no lymphadenopathy;  thyroid:  no enlargement/ tenderness/nodules; no carotid bruit or JVD. She is tender at L trapezius muscle, no spasm appreciable.  ?Back:    Spine nontender, no curvature, ROM normal, no CVA     tenderness  ?Lungs:     Clear to auscultation bilaterally without wheezes, rales or     ronchi; respirations unlabored  ?Chest Wall:    Nontender  ? Heart:    Regular rate and rhythm, S1 and S2 normal, no murmur, rub ?  or gallop  ?Breast Exam:    Deferred to GYN  ?Abdomen:     Soft,  non-tender, nondistended, normoactive bowel sounds,  ?  no masses, no hepatosplenomegaly  ?Genitalia:    Deferred to GYN  ?   ?Extremities:   No clubbing, cyanosis or edema. Very mildly tender at trochanteric bursa bilaterally.   ?Pulses:   2+ and symmetric all extremities  ?Skin:   Skin color, texture, turgor normal, no rashes or lesions  ?Lymph nodes:   Cervical, supraclavicular nodes normal  ?Neurologic:   CNII-XII intact, normal strength, sensation and gait; reflexes 2+ and symmetric throughout  ?        Psych:   Normal mood, affect, hygiene and grooming.   ? ? ?  03/03/2022  ?  8:50 AM 01/25/2022  ?  1:46 PM  ?GAD 7 : Generalized Anxiety Score  ?Nervous, Anxious, on Edge 0 1  ?Control/stop worrying 0 1  ?Worry too much - different things 0 1  ?Trouble relaxing 0 1  ?Restless 0 1  ?Easily annoyed or irritable 1 2  ?Afraid - awful might happen 0 2  ?Total GAD 7 Score 1 9  ?Anxiety Difficulty  Very difficult  ? ? ?Recent labs: ? ?Lab Results  ?Component Value Date  ? TSH 1.210 01/25/2022  ? ?Lab Results  ?Component Value Date   ? WBC 7.3 01/25/2022  ? HGB 15.0 01/25/2022  ? HCT 45.7 01/25/2022  ? MCV 99 (H) 01/25/2022  ? PLT 246 01/25/2022  ? ?  Chemistry   ?   ?Component Value Date/Time  ? NA 142 01/25/2022 1423  ? K 4.4 01/25/2022 1423  ? CL

## 2022-03-03 ENCOUNTER — Encounter: Payer: Self-pay | Admitting: Family Medicine

## 2022-03-03 ENCOUNTER — Ambulatory Visit: Payer: 59 | Admitting: Family Medicine

## 2022-03-03 VITALS — BP 128/80 | HR 84 | Ht 64.0 in | Wt 214.8 lb

## 2022-03-03 DIAGNOSIS — Z566 Other physical and mental strain related to work: Secondary | ICD-10-CM | POA: Diagnosis not present

## 2022-03-03 DIAGNOSIS — N3281 Overactive bladder: Secondary | ICD-10-CM

## 2022-03-03 DIAGNOSIS — Z720 Tobacco use: Secondary | ICD-10-CM

## 2022-03-03 DIAGNOSIS — Z23 Encounter for immunization: Secondary | ICD-10-CM | POA: Diagnosis not present

## 2022-03-03 DIAGNOSIS — I10 Essential (primary) hypertension: Secondary | ICD-10-CM | POA: Diagnosis not present

## 2022-03-03 DIAGNOSIS — Z6836 Body mass index (BMI) 36.0-36.9, adult: Secondary | ICD-10-CM

## 2022-03-03 DIAGNOSIS — G4700 Insomnia, unspecified: Secondary | ICD-10-CM

## 2022-03-03 DIAGNOSIS — Z0000 Encounter for general adult medical examination without abnormal findings: Secondary | ICD-10-CM | POA: Diagnosis not present

## 2022-03-03 LAB — POCT URINALYSIS DIP (PROADVANTAGE DEVICE)
Bilirubin, UA: NEGATIVE
Glucose, UA: NEGATIVE mg/dL
Ketones, POC UA: NEGATIVE mg/dL
Nitrite, UA: NEGATIVE
Protein Ur, POC: NEGATIVE mg/dL
Specific Gravity, Urine: 1.02
Urobilinogen, Ur: NEGATIVE
pH, UA: 7 (ref 5.0–8.0)

## 2022-03-03 MED ORDER — TRAZODONE HCL 100 MG PO TABS: 100.0000 mg | ORAL_TABLET | Freq: Every evening | ORAL | 6 refills | Status: AC | PRN

## 2022-03-25 ENCOUNTER — Other Ambulatory Visit: Payer: Self-pay | Admitting: Family Medicine

## 2022-03-25 DIAGNOSIS — G4700 Insomnia, unspecified: Secondary | ICD-10-CM

## 2022-03-25 NOTE — Telephone Encounter (Signed)
Pt was called and she advised that she is not out and insurance will only cover 30 days at a time . Pt will have enough to last throughout the weekend. [place]

## 2022-03-28 NOTE — Telephone Encounter (Signed)
Spoke with pharmacy and was told to disregard-we sent in rx for #[phone] 03/03/22 with 6 refills-they will fill. Denied 90 day request.

## 2022-03-28 NOTE — Telephone Encounter (Signed)
Looks like Kim called yesterday and patient can only have 30 day rx's at a time. She had enough to last the weekend so she will need a 30 day supply sent in today please (takes 1.5) daily, thanks.

## 2022-04-19 ENCOUNTER — Other Ambulatory Visit: Payer: Self-pay | Admitting: Family Medicine

## 2022-04-25 ENCOUNTER — Other Ambulatory Visit: Payer: Self-pay | Admitting: Family Medicine

## 2022-04-25 DIAGNOSIS — G4700 Insomnia, unspecified: Secondary | ICD-10-CM

## 2022-05-20 ENCOUNTER — Other Ambulatory Visit: Payer: Self-pay | Admitting: Physician Assistant

## 2022-05-30 ENCOUNTER — Other Ambulatory Visit: Payer: Self-pay | Admitting: Family Medicine

## 2022-05-30 DIAGNOSIS — G4700 Insomnia, unspecified: Secondary | ICD-10-CM

## 2022-05-30 NOTE — Telephone Encounter (Signed)
Pt has med check scheduled 8/23.  See if she needs this rx now or not.  Year supply not appropriate. If pt is not going to have enough med to last until visit? Is she taking 1 daily or 1.5 daily? If she needs before her visit, and wants to switch to 90 day supply, find out how much she is taking and write for 90d (90 vs 135, depending on how she takes), no refills. If she has enough until visit, will address then.

## 2022-05-30 NOTE — Telephone Encounter (Signed)
[facility] is requesting to fill pt trazodone for 90 days . Please advise [facility]

## 2022-06-14 NOTE — Progress Notes (Unsigned)
  No chief complaint on file.  Patient presents for 3 month follow-up on anxiety, hypertension, weight and tobacco use.  She was started on amlodipine and Buspar by Sula Soda in 01/2022. She had been having a lot of stress, and poor sleep at that time as well. At her physical in May, BP and anxiety were improved, tolerating medications without side effect.  BP's are running  Rare dizziness if she gets up too quickly. No headaches, chest pain, shortness of breath or edema.  BP Readings from Last 3 Encounters:  03/03/22 128/80  01/25/22 (!) 160/90  04/23/21 116/84    Stress/anxiety:  She continues on Buspar '5mg'$  BID last month.  Her GAD-7 prior to treatment was 9, and was down to 1 at her f/u visit in May. She is seeking new employment.  She has supportive friends, overall is dealing better.   Insomnia--Trazodone '100mg'$  was refilled at her physical. She was encouraged to cut back on alcohol, which can contribute to sleep issues (was having 1 glass of wine/night). She falls asleep fine, but wakes up during the night. Sometimes related to pain (hip). Melatonin gummies weren't helpful.  UPDATE--using trazodone?? She was to ask her husband re: snoring and apnea. Consider sleep study.  OAB--She continues to do well on vesicare '10mg'$  daily. She tolerates this better, and finds it more effective than oxybutynin.   Tobacco use--smokes in the evenings, at least 5/day, more on the weekends because she is home. She is not ready to quit. Counseled at physical (taper vs cold [place], encouraged to set quit date, coming up with new wind-down routine that doesn't involve wine or smoking).  Obesity At [facility] we discussed diet, exercise, encouraged cutting back on wine intake (which may also help with sleep, as well as weight loss).  Discussed tracking apps vs WW vs other ways to hold herself accountable re: diet/exercise. To check with insurance re: coverage for wt loss meds and set f/u visit if interested in  further discussing (if not successful with other methods).    PMH, PSH, SH reviewed   ROS:   PHYSICAL EXAM:  There were no vitals taken for this visit.  Wt Readings from Last 3 Encounters:  03/03/22 214 lb 12.8 oz (97.4 kg)  01/25/22 213 lb (96.6 kg)  04/23/21 209 lb (94.8 kg)      ASSESSMENT/PLAN:  Offer flu shot GAD-7 if reporting increased anxiety (if med working well, not needed)  HTN Work stress/anxiety OAB Obesity

## 2022-06-15 ENCOUNTER — Ambulatory Visit: Payer: 59 | Admitting: Family Medicine

## 2022-06-15 ENCOUNTER — Other Ambulatory Visit: Payer: Self-pay | Admitting: Family Medicine

## 2022-06-15 VITALS — BP 120/80 | HR 97 | Wt 217.0 lb

## 2022-06-15 DIAGNOSIS — G478 Other sleep disorders: Secondary | ICD-10-CM

## 2022-06-15 DIAGNOSIS — M7061 Trochanteric bursitis, right hip: Secondary | ICD-10-CM

## 2022-06-15 DIAGNOSIS — F419 Anxiety disorder, unspecified: Secondary | ICD-10-CM

## 2022-06-15 DIAGNOSIS — R0683 Snoring: Secondary | ICD-10-CM

## 2022-06-15 DIAGNOSIS — G4700 Insomnia, unspecified: Secondary | ICD-10-CM | POA: Diagnosis not present

## 2022-06-15 DIAGNOSIS — Z720 Tobacco use: Secondary | ICD-10-CM

## 2022-06-15 DIAGNOSIS — I10 Essential (primary) hypertension: Secondary | ICD-10-CM | POA: Diagnosis not present

## 2022-06-15 DIAGNOSIS — Z6837 Body mass index (BMI) 37.0-37.9, adult: Secondary | ICD-10-CM

## 2022-06-15 DIAGNOSIS — M7062 Trochanteric bursitis, left hip: Secondary | ICD-10-CM

## 2022-06-15 MED ORDER — AMLODIPINE BESYLATE 5 MG PO TABS: 5.0000 mg | ORAL_TABLET | Freq: Every day | ORAL | 0 refills | Status: DC

## 2022-06-15 MED ORDER — CHANTIX STARTING MONTH PAK 0.5 MG X 11 & 1 MG X 42 PO TBPK: ORAL_TABLET | ORAL | 0 refills | Status: DC

## 2022-06-15 MED ORDER — BUSPIRONE HCL 5 MG PO TABS: 5.0000 mg | ORAL_TABLET | Freq: Two times a day (BID) | ORAL | 1 refills | Status: DC

## 2022-06-15 MED ORDER — VARENICLINE TARTRATE 1 MG PO TABS: 1.0000 mg | ORAL_TABLET | Freq: Two times a day (BID) | ORAL | 1 refills | Status: DC

## 2022-06-15 MED ORDER — MELOXICAM 15 MG PO TABS: 15.0000 mg | ORAL_TABLET | Freq: Every day | ORAL | 0 refills | Status: DC

## 2022-06-15 NOTE — Patient Instructions (Addendum)
For the exercises on the handouts--hold all positions for 10-15 seconds, repeat 10 times and do them twice daily.  Hip bursitis--Take meloxicam once daily with food for up to 2 weeks (take it every day until your hip pain has resolved--this may be 7-10 days, or need the full 15 days). If this isn't helping with the hip pain, options include physical therapy or cortisone injections, or referral to a sports medicine doc or orthopedist.  You may take tylenol along with the meloxicam, but no ibuprofen/advil/motrin/aleve/naproxen/Goody or BC powder.  Since your mind sometimes keeps you awake at night, and since you seem to be "grazing" more, perhaps these are symptoms of anxiety. Maybe restart it for a bit and see how you feel, and then decide to see how you want to continue taking it (whether that be once daily, twice daily, or seeing if helps you at all to use just if/when needed on more stressful days).  Restart Chantix. Set a quit date for 2 weeks after starting. Encourage and support your husband's quitting efforts--if you both quit, you will be more successful.  Restart tracking your exercise and food per the FitBit app to help prevent weight gain with smoking cessation (and help weight loss overall). If you're struggling with weight loss on your own, let us know if you'd be interested in referral to the [facility] clinic through [facility].

## 2022-06-26 ENCOUNTER — Other Ambulatory Visit: Payer: Self-pay | Admitting: Family Medicine

## 2022-06-26 DIAGNOSIS — M7062 Trochanteric bursitis, left hip: Secondary | ICD-10-CM

## 2022-06-28 NOTE — Telephone Encounter (Signed)
Pt does not need a refill on this. She has 4-5 days left

## 2022-07-04 ENCOUNTER — Other Ambulatory Visit: Payer: Self-pay | Admitting: Family Medicine

## 2022-07-04 DIAGNOSIS — G4700 Insomnia, unspecified: Secondary | ICD-10-CM

## 2022-07-13 ENCOUNTER — Other Ambulatory Visit: Payer: Self-pay | Admitting: Family Medicine

## 2022-07-13 DIAGNOSIS — I10 Essential (primary) hypertension: Secondary | ICD-10-CM

## 2022-07-13 NOTE — Telephone Encounter (Signed)
Just wanted to make sure this was okay to fill until CPE in May?

## 2022-08-02 ENCOUNTER — Encounter: Payer: Self-pay | Admitting: Internal Medicine

## 2022-08-08 ENCOUNTER — Other Ambulatory Visit: Payer: Self-pay | Admitting: Family Medicine

## 2022-08-08 DIAGNOSIS — G4700 Insomnia, unspecified: Secondary | ICD-10-CM

## 2022-09-05 ENCOUNTER — Other Ambulatory Visit: Payer: Self-pay | Admitting: Family Medicine

## 2022-09-05 DIAGNOSIS — G4700 Insomnia, unspecified: Secondary | ICD-10-CM

## 2022-09-05 NOTE — Telephone Encounter (Signed)
This was auto-refill, patient has plenty and does not need. Denied.

## 2022-09-05 NOTE — Telephone Encounter (Signed)
Not sure if this is auto-refill or not.  I think she said she didn't take it daily. Need to know if she needs refill, and if she is taking, does she use 1 or 1.5 tablets at a time, (and would she want 90d supply)

## 2022-09-05 NOTE — Telephone Encounter (Signed)
Is this okay to refill? 

## 2022-10-01 ENCOUNTER — Other Ambulatory Visit: Payer: Self-pay | Admitting: Family Medicine

## 2022-10-01 DIAGNOSIS — Z720 Tobacco use: Secondary | ICD-10-CM

## 2022-10-03 NOTE — Telephone Encounter (Signed)
This is only supposed to be a 12 week course.  The starter pack and 2 months of the '1mg'$  BID that I prescribed was the full course. There is the potential to use it for an additional 12 weeks, but this needs discussion/visit to decide if appropriate. Happy to see her for a visit, can be virtual.

## 2022-10-03 NOTE — Telephone Encounter (Signed)
Patient confirmed, she did request this refill.

## 2022-10-04 NOTE — Telephone Encounter (Signed)
Patient actually had a refill on her bottle, she went and got for me. So she is okay. I explained to her the 12 week course and if she needed more refills after that (which she didn't feel like she would) that she would need to call and schedule appt.

## 2022-10-07 ENCOUNTER — Other Ambulatory Visit: Payer: Self-pay | Admitting: Family Medicine

## 2022-10-07 DIAGNOSIS — G4700 Insomnia, unspecified: Secondary | ICD-10-CM

## 2022-10-07 NOTE — Telephone Encounter (Signed)
Not sure how much/often she takes to know if this is an auto refill or if she truly needs it refilled (would be out if taking 1.5 tablets every day, but in August it didn't sound like she used it every day)

## 2022-10-07 NOTE — Telephone Encounter (Signed)
Refill request last apt 06/15/22 next apt 03/13/23 last refilled 03/03/22 filled #[phone] with 6 refills.

## 2022-10-10 NOTE — Telephone Encounter (Signed)
Pt has plenty and does not need anymore

## 2022-10-21 ENCOUNTER — Other Ambulatory Visit: Payer: Self-pay | Admitting: Family Medicine

## 2022-12-02 ENCOUNTER — Other Ambulatory Visit: Payer: Self-pay | Admitting: Family Medicine

## 2022-12-02 DIAGNOSIS — F419 Anxiety disorder, unspecified: Secondary | ICD-10-CM

## 2022-12-26 ENCOUNTER — Other Ambulatory Visit: Payer: Self-pay | Admitting: Family Medicine

## 2022-12-26 DIAGNOSIS — F419 Anxiety disorder, unspecified: Secondary | ICD-10-CM

## 2023-02-15 ENCOUNTER — Other Ambulatory Visit: Payer: Self-pay | Admitting: Family Medicine

## 2023-02-15 DIAGNOSIS — Z1231 Encounter for screening mammogram for malignant neoplasm of breast: Secondary | ICD-10-CM

## 2023-02-20 ENCOUNTER — Ambulatory Visit
Admission: RE | Admit: 2023-02-20 | Discharge: 2023-02-20 | Disposition: A | Discharge status: Home / Self Care | Payer: 59 | Source: Ambulatory Visit | Attending: Family Medicine | Admitting: Family Medicine

## 2023-02-20 DIAGNOSIS — Z1231 Encounter for screening mammogram for malignant neoplasm of breast: Secondary | ICD-10-CM

## 2023-02-23 ENCOUNTER — Other Ambulatory Visit: Payer: Self-pay | Admitting: Family Medicine

## 2023-02-23 DIAGNOSIS — F419 Anxiety disorder, unspecified: Secondary | ICD-10-CM

## 2023-03-11 DIAGNOSIS — N3281 Overactive bladder: Secondary | ICD-10-CM | POA: Insufficient documentation

## 2023-03-11 NOTE — Progress Notes (Unsigned)
No chief complaint on file.  Jane Valdez is a 57 y.o. female who presents for a complete physical.  She sees Dr. Tenny Craw for GYN care.  Last seen in August. At that time she had bilateral hip bursitis.   Hypertension:  diagnosed last year. Compliant with amlodipine 5mg  daily. She tries to follow a low sodium diet. BP's are running  She denies headaches, dizziness, chest pain, palpitations, edema, shortness of breath.  Stress/anxiety:  She had been treated with Buspar 5mg  BID.  She reported at her last visit that she didn't need it anymore, felt like she was dealing with things better without medications.  She did report that her mind kept her awake sometimes, and that she was "grazing" more--we suggested that this could be symptoms of anxiety, and that she could try the buspar again to see if it helped with sleep/overeating, even if just using it on stressful days. She had job stress, was looking for a different job. UPDATE   Insomnia-- She was encouraged to cut back on alcohol (had been having 1 glass of wine/night).  Hip bursitis had also been interfering with her sleep at her last visit. She uses trazodone with good result.  UPDATE   OAB--She continues to do well on vesicare 10mg  daily. (She tolerates this better, and finds it more effective than oxybutynin.)   Tobacco use-- She was prescribed a course of Chantix at her last visit in August 2023.  She had previously been successful with thi. She had been smoking in the evenings (5+/d, more on the weekends)    Obesity:  We previously discussed diet, exercise, encouraged cutting back on wine intake, and use of tracking apps vs WW vs other ways to hold herself accountable re: diet/exercise. She was advised to check with insurance re: coverage for wt loss meds and set f/u visit if interested in further discussing (if not successful with other methods).   Immunization History  Administered Date(s) Administered    Influenza,inj,Quad PF,6+ Mos 07/24/2017   Influenza-Unspecified 07/24/2016, 07/24/2018, 08/16/2019   Moderna Sars-Covid-2 Vaccination 01/10/2020, 02/11/2020   Pneumococcal Polysaccharide-23 03/03/2022   Tdap 01/28/2021   Zoster Recombinat (Shingrix) 03/01/2021, 05/03/2021   Didn't get a flu shot this year.  Doesn't want COVID booster. Last Pap smear: 08/2019 by Dr. Tenny Craw (normal, negative HPV). Had annual visit 08/2022 Last mammogram: 01/2023 Last colonoscopy: 04/2021, 4 polyps--adenomatous and sessile serrated; 3 year f/u recommended (Dr. Leone Payor).  Prior colonoscopy was 09/2017, with a 10mm adenomatous colon polyp; Last DEXA: never Dentist: twice a year Ophtho: years ago Exercise:      Lipids: Lab Results  Component Value Date   CHOL 172 03/01/2021   HDL 54 03/01/2021   LDLCALC 102 (H) 03/01/2021   TRIG 86 03/01/2021   CHOLHDL 3.2 03/01/2021   Vitamin D-OH 32.7 in 02/2021   PMH, PSH, SH and FH reviewed    ROS:  The patient denies anorexia, fever, recent weight changes, headaches,  vision changes, decreased hearing, ear pain, sore throat, breast concerns, chest pain, palpitations, syncope, dyspnea on exertion, cough, swelling, nausea, vomiting, diarrhea, constipation, abdominal pain, melena, hematochezia, hematuria, dysuria, vaginal discharge, odor or itch, genital lesions, numbness, tingling, weakness, tremor, suspicious skin lesions, depression, abnormal bleeding/bruising, or enlarged lymph nodes. Heartburn rare Mild hot flashes, no sweats.  Stress and Insomnia--per HPI Some snoring.  Not aware of apnea (husband hasn't mentioned).   Hip pain? Neck pain?   PHYSICAL EXAM:  There were no vitals taken for this visit.  Wt Readings from Last 3 Encounters:  06/15/22 217 lb (98.4 kg)  03/03/22 214 lb 12.8 oz (97.4 kg)  01/25/22 213 lb (96.6 kg)    General Appearance:    Alert, cooperative, no distress, appears stated age. Not changed into gown.  Head:    Normocephalic,  without obvious abnormality, atraumatic  Eyes:    PERRL, conjunctiva/corneas clear, EOM's intact, fundi    benign  Ears:    Normal TM's and external ear canals  Nose:   Normal, no drainage  Throat:   Normal mucosa, no lesions  Neck:   Supple, no lymphadenopathy;  thyroid:  no enlargement/ tenderness/nodules; no carotid bruit or JVD.   Back:    Spine nontender, no curvature, ROM normal, no CVA     tenderness  Lungs:     Clear to auscultation bilaterally without wheezes, rales or     ronchi; respirations unlabored  Chest Wall:    Nontender   Heart:    Regular rate and rhythm, S1 and S2 normal, no murmur, rub   or gallop  Breast Exam:    Deferred to GYN  Abdomen:     Soft, non-tender, nondistended, normoactive bowel sounds,    no masses, no hepatosplenomegaly  Genitalia:    Deferred to GYN     Extremities:   No clubbing, cyanosis or edema. Very mildly tender at trochanteric bursa bilaterally.   Pulses:   2+ and symmetric all extremities  Skin:   Skin color, texture, turgor normal, no rashes or lesions  Lymph nodes:   Cervical, supraclavicular nodes normal  Neurologic:   CNII-XII intact, normal strength, sensation and gait; reflexes 2+ and symmetric throughout          Psych:   Normal mood, affect, hygiene and grooming.       03/03/2022    8:50 AM 01/25/2022    1:46 PM  GAD 7 : Generalized Anxiety Score  Nervous, Anxious, on Edge 0 1  Control/stop worrying 0 1  Worry too much - different things 0 1  Trouble relaxing 0 1  Restless 0 1  Easily annoyed or irritable 1 2  Afraid - awful might happen 0 2  Total GAD 7 Score 1 9  Anxiety Difficulty  Very difficult     ASSESSMENT/PLAN:    GAD-7, phq 2/9  Is she smoking? Is she taking buspar and/or trazodone? Regularly or prn?  Did she get flu shot? Offer/decline COVID booster   Discussed monthly self breast exams and yearly mammograms; at least 30 minutes of aerobic activity at least 5 days/week, weight-bearing exercise at least  2x/week; proper sunscreen use reviewed; healthy diet, including goals of calcium and vitamin D intake and alcohol recommendations (less than or equal to 1 drink/day) reviewed; regular seatbelt use; changing batteries in smoke detectors.  Immunization recommendations discussed--yearly flu shots are recommended.  COVID booster recommended, declined.  Colonoscopy recommendations reviewed--UTD, due again 04/2024.

## 2023-03-11 NOTE — Patient Instructions (Incomplete)
  HEALTH MAINTENANCE RECOMMENDATIONS:  It is recommended that you get at least 30 minutes of aerobic exercise at least 5 days/week (for weight loss, you may need as much as 60-90 minutes). This can be any activity that gets your heart rate up. This can be divided in 10-15 minute intervals if needed, but try and build up your endurance at least once a week.  Weight bearing exercise is also recommended twice weekly.  Eat a healthy diet with lots of vegetables, fruits and fiber.  "Colorful" foods have a lot of vitamins (ie green vegetables, tomatoes, red peppers, etc).  Limit sweet tea, regular sodas and alcoholic beverages, all of which has a lot of calories and sugar.  Up to 1 alcoholic drink daily may be beneficial for women (unless trying to lose weight, watch sugars).  Drink a lot of water.  Calcium recommendations are 1200-1500 mg daily (1500 mg for postmenopausal women or women without ovaries), and vitamin D 1000 IU daily.  This should be obtained from diet and/or supplements (vitamins), and calcium should not be taken all at once, but in divided doses.  Monthly self breast exams and yearly mammograms for women over the age of 27 is recommended.  Sunscreen of at least SPF 30 should be used on all sun-exposed parts of the skin when outside between the hours of 10 am and 4 pm (not just when at beach or pool, but even with exercise, golf, tennis, and yard work!)  Use a sunscreen that says "broad spectrum" so it covers both UVA and UVB rays, and make sure to reapply every 1-2 hours.  Remember to change the batteries in your smoke detectors when changing your clock times in the spring and fall. Carbon monoxide detectors are recommended for your home.  Use your seat belt every time you are in a car, and please drive safely and not be distracted with cell phones and texting while driving.  We discusssed hip bursitis, and treatments including referral for physical therapy vs a trial of cortisone  injections.  Apparently the anti-inflammatory prescribed in August wasn't effective.  Please work on getting back to not smoking anymore.  Set a quit date, and encourage your husband to quit as well.

## 2023-03-13 ENCOUNTER — Encounter: Payer: Self-pay | Admitting: Family Medicine

## 2023-03-13 ENCOUNTER — Ambulatory Visit (INDEPENDENT_AMBULATORY_CARE_PROVIDER_SITE_OTHER): Payer: 59 | Admitting: Family Medicine

## 2023-03-13 VITALS — BP 122/82 | HR 72 | Ht 64.5 in | Wt 198.0 lb

## 2023-03-13 DIAGNOSIS — R400 Somnolence: Secondary | ICD-10-CM

## 2023-03-13 DIAGNOSIS — Z720 Tobacco use: Secondary | ICD-10-CM | POA: Diagnosis not present

## 2023-03-13 DIAGNOSIS — M25552 Pain in left hip: Secondary | ICD-10-CM

## 2023-03-13 DIAGNOSIS — I10 Essential (primary) hypertension: Secondary | ICD-10-CM | POA: Diagnosis not present

## 2023-03-13 DIAGNOSIS — F419 Anxiety disorder, unspecified: Secondary | ICD-10-CM | POA: Diagnosis not present

## 2023-03-13 DIAGNOSIS — G4700 Insomnia, unspecified: Secondary | ICD-10-CM

## 2023-03-13 DIAGNOSIS — Z0000 Encounter for general adult medical examination without abnormal findings: Secondary | ICD-10-CM | POA: Diagnosis not present

## 2023-03-13 DIAGNOSIS — Z6833 Body mass index (BMI) 33.0-33.9, adult: Secondary | ICD-10-CM | POA: Diagnosis not present

## 2023-03-13 DIAGNOSIS — N3281 Overactive bladder: Secondary | ICD-10-CM

## 2023-03-13 DIAGNOSIS — E6609 Other obesity due to excess calories: Secondary | ICD-10-CM

## 2023-03-13 DIAGNOSIS — G478 Other sleep disorders: Secondary | ICD-10-CM

## 2023-03-13 DIAGNOSIS — M25551 Pain in right hip: Secondary | ICD-10-CM

## 2023-03-13 LAB — POCT URINALYSIS DIP (PROADVANTAGE DEVICE)
Bilirubin, UA: NEGATIVE
Blood, UA: NEGATIVE
Glucose, UA: NEGATIVE mg/dL
Ketones, POC UA: NEGATIVE mg/dL
Leukocytes, UA: NEGATIVE
Nitrite, UA: NEGATIVE
Protein Ur, POC: NEGATIVE mg/dL
Specific Gravity, Urine: 1.01
Urobilinogen, Ur: 0.2
pH, UA: 7.5 (ref 5.0–8.0)

## 2023-03-13 MED ORDER — SOLIFENACIN SUCCINATE 10 MG PO TABS: 10.0000 mg | ORAL_TABLET | Freq: Every day | ORAL | 3 refills | Status: DC

## 2023-03-13 MED ORDER — AMLODIPINE BESYLATE 5 MG PO TABS: 5.0000 mg | ORAL_TABLET | Freq: Every day | ORAL | 1 refills | Status: DC

## 2023-03-13 NOTE — Assessment & Plan Note (Signed)
Congratulated on weight loss thus far with low carb diet. Cont low carb , high fiber diet. Encouraged increasing aerobic exercise to at least 150 mins/week. Comorbidities include HTN, hip pain, moods/anxiety.

## 2023-03-13 NOTE — Assessment & Plan Note (Signed)
Quit for 2-2.5 months with Chantix. Encouraged setting another quit date, counseled.

## 2023-03-13 NOTE — Assessment & Plan Note (Signed)
Component of both trochanteric bursitis, and L IT band syndrome. Shown stretches.  Meloxicam previously not helpful, declines another course.  Offered/declined course of PT. She would like to try stretches, and will contact us for PT referral if not improving

## 2023-03-13 NOTE — Assessment & Plan Note (Signed)
Well controlled. Cont low Na diet, amlodipine, and continued weight loss.

## 2023-03-13 NOTE — Assessment & Plan Note (Signed)
Controlled with trazodone prn.  Hip pain contributes to disrupted sleep.

## 2023-03-13 NOTE — Assessment & Plan Note (Signed)
Cont vesicare. Limit caffeine, alcohol

## 2023-03-13 NOTE — Assessment & Plan Note (Signed)
Improved.  May contribute to some sleep troubles--may want to try using buspar BID more regularly to see if it is more effective, and if helps with sleep

## 2023-03-14 LAB — CBC WITH DIFFERENTIAL/PLATELET
Basophils Absolute: 0 10*3/uL (ref 0.0–0.2)
Basos: 1 %
EOS (ABSOLUTE): 0.1 10*3/uL (ref 0.0–0.4)
Eos: 2 %
Hematocrit: 46.5 % (ref 34.0–46.6)
Hemoglobin: 14.3 g/dL (ref 11.1–15.9)
Immature Grans (Abs): 0 10*3/uL (ref 0.0–0.1)
Immature Granulocytes: 0 %
Lymphocytes Absolute: 2 10*3/uL (ref 0.7–3.1)
Lymphs: 31 %
MCH: 30.2 pg (ref 26.6–33.0)
MCHC: 30.8 g/dL — ABNORMAL LOW (ref 31.5–35.7)
MCV: 98 fL — ABNORMAL HIGH (ref 79–97)
Monocytes Absolute: 0.7 10*3/uL (ref 0.1–0.9)
Monocytes: 10 %
Neutrophils Absolute: 3.6 10*3/uL (ref 1.4–7.0)
Neutrophils: 56 %
Platelets: 242 10*3/uL (ref 150–450)
RBC: 4.73 x10E6/uL (ref 3.77–5.28)
RDW: 12 % (ref 11.7–15.4)
WBC: 6.4 10*3/uL (ref 3.4–10.8)

## 2023-03-14 LAB — LIPID PANEL
Chol/HDL Ratio: 2.7 ratio (ref 0.0–4.4)
Cholesterol, Total: 188 mg/dL (ref 100–199)
HDL: 69 mg/dL (ref >39)
LDL Chol Calc (NIH): 106 mg/dL — ABNORMAL HIGH (ref 0–99)
Triglycerides: 70 mg/dL (ref 0–149)
VLDL Cholesterol Cal: 13 mg/dL (ref 5–40)

## 2023-03-14 LAB — CMP14+EGFR
ALT: 17 IU/L (ref 0–32)
AST: 16 IU/L (ref 0–40)
Albumin/Globulin Ratio: 1.9 (ref 1.2–2.2)
Albumin: 4.3 g/dL (ref 3.8–4.9)
Alkaline Phosphatase: 93 IU/L (ref 44–121)
BUN/Creatinine Ratio: 22 (ref 9–23)
BUN: 13 mg/dL (ref 6–24)
Bilirubin Total: 0.5 mg/dL (ref 0.0–1.2)
CO2: 26 mmol/L (ref 20–29)
Calcium: 9.3 mg/dL (ref 8.7–10.2)
Chloride: 105 mmol/L (ref 96–106)
Creatinine, Ser: 0.6 mg/dL (ref 0.57–1.00)
Globulin, Total: 2.3 g/dL (ref 1.5–4.5)
Glucose: 85 mg/dL (ref 70–99)
Potassium: 4.6 mmol/L (ref 3.5–5.2)
Sodium: 142 mmol/L (ref 134–144)
Total Protein: 6.6 g/dL (ref 6.0–8.5)
eGFR: 105 mL/min/{1.73_m2} (ref >59)

## 2023-03-14 LAB — HIV ANTIBODY (ROUTINE TESTING W REFLEX): HIV Screen 4th Generation wRfx: NONREACTIVE

## 2023-04-14 ENCOUNTER — Ambulatory Visit (HOSPITAL_BASED_OUTPATIENT_CLINIC_OR_DEPARTMENT_OTHER): Payer: 59 | Attending: Family Medicine | Admitting: Internal Medicine

## 2023-04-14 DIAGNOSIS — R400 Somnolence: Secondary | ICD-10-CM | POA: Insufficient documentation

## 2023-04-14 DIAGNOSIS — G478 Other sleep disorders: Secondary | ICD-10-CM

## 2023-04-14 DIAGNOSIS — G4733 Obstructive sleep apnea (adult) (pediatric): Secondary | ICD-10-CM | POA: Insufficient documentation

## 2023-04-30 DIAGNOSIS — G478 Other sleep disorders: Secondary | ICD-10-CM

## 2023-04-30 NOTE — Procedures (Signed)
    Patient Name: Jane Valdez, Jane Valdez Date: 04/14/2023 Gender: Female D.O.B: [birth year] Age (years): 56 Referring Provider: Joselyn Arrow Height (inches): 64 Interpreting Physician: Jetty Duhamel MD, ABSM Weight (lbs): 193 RPSGT: Zalma Sink BMI: 33 MRN: [id number] Neck Size: 15.50  CLINICAL INFORMATION Sleep Study Type: HST Indication for sleep study: Insomnia with OSA Epworth Sleepiness Score: 10  SLEEP STUDY TECHNIQUE A multi-channel overnight portable sleep study was performed. The channels recorded were: nasal airflow, thoracic respiratory movement, and oxygen saturation with a pulse oximetry. Snoring was also monitored.  MEDICATIONS Patient self administered medications include: none reported.  SLEEP ARCHITECTURE Patient was studied for 377 minutes. The sleep efficiency was 99.1 % and the patient was supine for 0%. The arousal index was 0.0 per hour.  RESPIRATORY PARAMETERS The overall AHI was 46.8 per hour, with a central apnea index of 0.2 per hour. The oxygen nadir was 87% during sleep.  CARDIAC DATA Mean heart rate during sleep was 72.1 bpm.  IMPRESSIONS - Severe obstructive sleep apnea occurred during this study (AHI = 46.8/h). - No significant central sleep apnea occurred during this study (CAI = 0.2/h). - Mild oxygen desaturation was noted during this study (Min O2 = 87%, Mean 95%). - Patient snored.  DIAGNOSIS - Obstructive Sleep Apnea (G47.33)  RECOMMENDATIONS - Suggest CPAP titration sleep study or autopap - Be careful with alcohol, sedatives and other CNS depressants that may worsen sleep apnea and disrupt normal sleep architecture. - Sleep hygiene should be reviewed to assess factors that may improve sleep quality. - Weight management and regular exercise should be initiated or continued.  [Electronically signed] 04/30/2023 12:01 PM  Jetty Duhamel MD, ABSM Diplomate, American Board of Sleep Medicine NPI: [id number]                          Jetty Duhamel Diplomate, American Board of Sleep Medicine  ELECTRONICALLY SIGNED ON:  04/30/2023, 11:58 AM [facility] PH: [phone]   FX: [phone] ACCREDITED BY THE AMERICAN ACADEMY OF SLEEP MEDICINE

## 2023-05-01 ENCOUNTER — Other Ambulatory Visit: Payer: Self-pay | Admitting: *Deleted

## 2023-05-01 DIAGNOSIS — G4733 Obstructive sleep apnea (adult) (pediatric): Secondary | ICD-10-CM

## 2023-05-01 NOTE — Progress Notes (Signed)
Patient advised and sent CPAP order to [facility].

## 2023-05-30 ENCOUNTER — Other Ambulatory Visit: Payer: Self-pay | Admitting: Family Medicine

## 2023-05-30 DIAGNOSIS — F419 Anxiety disorder, unspecified: Secondary | ICD-10-CM

## 2023-07-23 ENCOUNTER — Other Ambulatory Visit: Payer: Self-pay | Admitting: Family Medicine

## 2023-07-23 DIAGNOSIS — Z720 Tobacco use: Secondary | ICD-10-CM

## 2023-07-24 ENCOUNTER — Other Ambulatory Visit: Payer: Self-pay | Admitting: *Deleted

## 2023-07-24 DIAGNOSIS — I10 Essential (primary) hypertension: Secondary | ICD-10-CM

## 2023-07-24 MED ORDER — AMLODIPINE BESYLATE 5 MG PO TABS
5.0000 mg | ORAL_TABLET | Freq: Every day | ORAL | 0 refills | Status: DC
Start: 2023-07-24 — End: 2023-11-13

## 2023-09-17 NOTE — Progress Notes (Unsigned)
No chief complaint on file.  Patient presents for 6 month follow-up on chronic problems.  Hypertension:  Compliant with amlodipine 5mg  daily. She tries to follow a low sodium diet. BP's are running   She denies headaches, dizziness, chest pain, palpitations, edema, shortness of breath. Occ L ankle swelling (related to prior issues, surgery).   BP Readings from Last 3 Encounters:  03/13/23 122/82  06/15/22 120/80  03/03/22 128/80       Stress/anxiety:    At last visit, she mentioned taking Buspar 3-4x/week, while at work, mid-morning, mostly taking it just once daily and not in the evenings.  She was still having times where her mind would keep her awake.  When off Buspar in the past, she noted grazing more, as well as insomnia, and we had suggested restarting it to see if that helped (related to anxiety).  Today she reports    Insomnia--She uses trazodone with good results, during the week.  Doesn't take it on the weekends. Hip discomfort (bursitis) has interfered some with her sleep, as well as hot flashes.  She has no troubles falling asleep, and does seem to sleep better when she takes the trazodone.  Cut back on wine to several nights/week (previously had been nightly), didn't notice a difference in her sleep.  She had reported waking herself up snoring, and unrefreshed sleep, so a sleep study was done in June: IMPRESSIONS - Severe obstructive sleep apnea occurred during this study (AHI = 46.8/h). - No significant central sleep apnea occurred during this study (CAI = 0.2/h). - Mild oxygen desaturation was noted during this study (Min O2 = 87%, Mean 95%). - Patient snored.  She was referred for CPAP ***    Menopausal symptoms--she  went off norethindrone birth control in 09/2022.  She has been having more hot flashes during the day and at night (not much sweats). She hasn't had any menstrual bleeding or spotting.   OAB--She continues to take vesicare 10mg  daily. (She  tolerates this better than oxybutynin.)  At her physical she reported that it didn't seem to be working as well as it used to, has more leakage with urgency. 1 diet drink (caffeine)/day and 1 cup of coffee/day.   Tobacco use--***UPDATE She was prescribed a course of Chantix at her last visit in August 2023, as this had helped her quit in the past.  She had been smoking in the evenings (5+/d, more on the weekends). With Chantix, she reports she quit for 2-2.5 months.  Started again when she went to the beach (a trigger for her)--only smoked 1 that week, but smoking more gradually since then. Husband still smokes. She is currently smoking about 5 cigarettes/day. She is interested in quitting, and hopes she can get her husband to quit also.    Obesity: ***UPDATE We previously discussed diet, exercise, encouraged cutting back on wine intake, and use of tracking apps vs [employer] vs other ways to hold herself accountable re: diet/exercise. She was advised to check with insurance re: coverage for wt loss meds and set f/u visit if interested in further discussing (if not successful with other methods). She has been eating a low carb diet with slow but steady weight loss.  1 cheat day/week (Sunday)--where she has some carbs, usually pasta, or yesterday had a croissant.    PMH, PSH, SH reviewed   ROS: no fever, chills, dizziness, chest pain, shortness of breath, GI or GU complaints.   No bleeding, bruising, rash.   Moods are good.  Hip pain? Insomnia? Urinary sx?   PHYSICAL EXAM:  There were no vitals taken for this visit.  Wt Readings from Last 3 Encounters:  03/13/23 198 lb (89.8 kg)  06/15/22 217 lb (98.4 kg)  03/03/22 214 lb 12.8 oz (97.4 kg)   Pleasant, obese female in no distress HEENT: conjunctiva and sclera are clear, EOMI Neck: no lyphadenpathy, thyromegaly or mass Heart: regular rate and rhythm Lungs: clear bilaterally Back: no CVA tenderness Abdomen: soft,  nontender Extremities: No edema, 2+ pulses Psych: normal mood, affect, hygiene and grooming Neuro: alert and oriented, cranial nerves grossly intact, normal gait   ASSESSMENT/PLAN:   Did she start CPAP?  If so, do we need to document anything for insurance?  Likely need to get compliance report  Still smoking?  Offer flu/covid Amlodipine not needed until 12/30

## 2023-09-18 ENCOUNTER — Ambulatory Visit: Payer: 59 | Admitting: Family Medicine

## 2023-09-18 ENCOUNTER — Encounter: Payer: Self-pay | Admitting: Family Medicine

## 2023-09-18 VITALS — BP 120/76 | HR 80 | Ht 64.0 in | Wt 189.8 lb

## 2023-09-18 DIAGNOSIS — I10 Essential (primary) hypertension: Secondary | ICD-10-CM

## 2023-09-18 DIAGNOSIS — F419 Anxiety disorder, unspecified: Secondary | ICD-10-CM

## 2023-09-18 DIAGNOSIS — G4700 Insomnia, unspecified: Secondary | ICD-10-CM

## 2023-09-18 DIAGNOSIS — N959 Unspecified menopausal and perimenopausal disorder: Secondary | ICD-10-CM

## 2023-09-18 DIAGNOSIS — G4733 Obstructive sleep apnea (adult) (pediatric): Secondary | ICD-10-CM

## 2023-09-18 DIAGNOSIS — E66811 Obesity, class 1: Secondary | ICD-10-CM

## 2023-09-18 DIAGNOSIS — Z720 Tobacco use: Secondary | ICD-10-CM

## 2023-09-18 DIAGNOSIS — Z6832 Body mass index (BMI) 32.0-32.9, adult: Secondary | ICD-10-CM

## 2023-09-18 DIAGNOSIS — E6609 Other obesity due to excess calories: Secondary | ICD-10-CM

## 2023-09-18 DIAGNOSIS — M542 Cervicalgia: Secondary | ICD-10-CM

## 2023-09-18 NOTE — Patient Instructions (Addendum)
It is important that we get your sleep apnea treated.  Please check with your insurance first to see if there is a preferred provider for the CPAP equipment.  You may need to call back the place you spoke to originally for further information.  If we can help, let us know, especially if we need to put in a new referral to a different CPAP provider. Work hard to lose weight and avoid sleeping on your back.  Try and limit carbs (use whole grains), increase protein and fiber in your diet.  Please work to cut down on smoking, and set a quit date with your husband. Work on thinking about your triggers, and changing up your routines so that smoking isn't out of habit/automatic. Try and pair your quitting with some regular physical exercise. Buy the patches and the gum now to help. 1-800-QUITNOW and [place]Quitline.com are good resources.  Do the neck stretches 2x/daily, 10 repetitions each time.

## 2023-11-11 ENCOUNTER — Other Ambulatory Visit: Payer: Self-pay | Admitting: Family Medicine

## 2023-11-11 DIAGNOSIS — I10 Essential (primary) hypertension: Secondary | ICD-10-CM

## 2024-03-23 ENCOUNTER — Other Ambulatory Visit: Payer: Self-pay | Admitting: Family Medicine

## 2024-03-23 DIAGNOSIS — N3281 Overactive bladder: Secondary | ICD-10-CM

## 2024-03-24 NOTE — Patient Instructions (Incomplete)
  HEALTH MAINTENANCE RECOMMENDATIONS:  It is recommended that you get at least 30 minutes of aerobic exercise at least 5 days/week (for weight loss, you may need as much as 60-90 minutes). This can be any activity that gets your heart rate up. This can be divided in 10-15 minute intervals if needed, but try and build up your endurance at least once a week.  Weight bearing exercise is also recommended twice weekly.  Eat a healthy diet with lots of vegetables, fruits and fiber.  "Colorful" foods have a lot of vitamins (ie green vegetables, tomatoes, red peppers, etc).  Limit sweet tea, regular sodas and alcoholic beverages, all of which has a lot of calories and sugar.  Up to 1 alcoholic drink daily may be beneficial for women (unless trying to lose weight, watch sugars).  Drink a lot of water .  Calcium recommendations are 1200-1500 mg daily (1500 mg for postmenopausal women or women without ovaries), and vitamin D  1000 IU daily.  This should be obtained from diet and/or supplements (vitamins), and calcium should not be taken all at once, but in divided doses.  Monthly self breast exams and yearly mammograms for women over the age of 2 is recommended.  Sunscreen of at least SPF 30 should be used on all sun-exposed parts of the skin when outside between the hours of 10 am and 4 pm (not just when at beach or pool, but even with exercise, golf, tennis, and yard work!)  Use a sunscreen that says "broad spectrum" so it covers both UVA and UVB rays, and make sure to reapply every 1-2 hours.  Remember to change the batteries in your smoke detectors when changing your clock times in the spring and fall. Carbon monoxide detectors are recommended for your home.  Use your seat belt every time you are in a car, and please drive safely and not be distracted with cell phones and texting while driving.  Please get yearly flu shots in the Fall. Please schedule your mammogram. You are due for your colonoscopy in  July.  Contact [facility] in July if you don't hear from them.  Goal is for blood pressure to be <130/80, occasional fluctuations higher are okay. Daily exercise, low sodium diet and weight loss will all help keep blood pressure down.  You can discuss sleep apnea treatments with your dentist (if you are considering oral appliance).  Please also check into the cost of  CPAP with your insurance (see if there is a preferred DME provider for the CPAP).  Restart the Chantix , and use it for the full 3 months. Consider also going through [place]quitline.com for other resources and coaching.  Take the meloxicam  once daily for your bursitis. Do NOT take any aleve, ibuprofen  etc while taking the meloxicam . The only other thing you can take for pain is tylenol . Do the exercises per handout, holding each stretch for 10-15 seconds, doing 10 repetitions, twice daily.  If you have ongoing pain in your hips, you can return for cortisone injections, or be referred for physical therapy.

## 2024-03-24 NOTE — Progress Notes (Unsigned)
 No chief complaint on file.  Jane Valdez is a 58 y.o. female who presents for a complete physical.  She sees Dr. Avanell Bob for GYN care, last visit 10/2023.  Hypertension:  Compliant with amlodipine  5mg  daily. She tries to follow a low sodium diet. BP's are running ***   She denies headaches, dizziness, chest pain, palpitations, edema, shortness of breath. Occ L ankle swelling (related to prior issues, surgery).   BP Readings from Last 3 Encounters:  09/18/23 120/76  03/13/23 122/82  06/15/22 120/80      Stress/anxiety and insomnia: At last visit 6 months ago she reported sleeping better, not using trazodone , and was only using Buspar  as needed (going a couple of weeks between doses at times, denied stress.  Postmenopausal symptoms:  She had been going to [facility] (for HRT and also for compounded semaglutide), but it got too expensive. She was off of her HRT for a while and hot flashes returned.  HRT was restarted after seeing Dr. Avanell Bob in January. ***UPDATE   She previously reported waking herself up snoring, and unrefreshed sleep, so a sleep study was done in June 2024: IMPRESSIONS - Severe obstructive sleep apnea occurred during this study (AHI = 46.8/h). - No significant central sleep apnea occurred during this study (CAI = 0.2/h). - Mild oxygen desaturation was noted during this study (Min O2 = 87%, Mean 95%). - Patient snored.   She was referred for CPAP, reports it was going to be very expensive ($3000?), so didn't follow through.  ***UPDATE    OAB--She continues to take vesicare  10mg  daily. (She tolerates this better than oxybutynin .)   She still has some leakage with urgency. This is unchanged. No change in caffeine intake (2/d).  Gets up at least once a night most nights.   Tobacco use--6 months ago she reported smoking 1/2 PPD (up from 5/d).  She wanted to try quitting along with her husband. She previously quit x 2-2.5 months with use of Chantix . Going to the beach was  a trigger for her to start smoking again.  ***UPDATE    Obesity: We previously discussed diet, exercise, encouraged cutting back on wine intake, and use of tracking apps vs [employer] vs other ways to hold herself accountable re: diet/exercise. She had done low dose semaglutide with [facility] without much difference and was too expensive.  She had been losing weight following a low carb diet, with 1 cheat day/week (Sunday)-- has some carbs, usually pasta, or yesterday had a croissant.   Immunization History  Administered Date(s) Administered   Influenza,inj,Quad PF,6+ Mos 07/24/2017   Influenza-Unspecified 07/24/2016, 07/24/2018, 08/16/2019   Moderna Sars-Covid-2 Vaccination 01/10/2020, 02/11/2020   Pneumococcal Polysaccharide-23 03/03/2022   Tdap 01/28/2021   Zoster Recombinant(Shingrix ) 03/01/2021, 05/03/2021   Last Pap smear: 08/2022 with Dr. Avanell Bob Last mammogram: 01/2023 Last colonoscopy: 04/2021, 4 polyps--adenomatous and sessile serrated; 3 year f/u recommended (Dr. Willy Harvest).  Prior colonoscopy was 09/2017, with a 10mm adenomatous colon polyp; Last DEXA: never Dentist: twice a year Ophtho: 07/2022 Exercise:   walking (exercise tape) 30 minutes 2x/week. Has a gym at work, plans to meet a friend at work to do [employer] before work.   Lipids: Lab Results  Component Value Date   CHOL 188 03/13/2023   HDL 69 03/13/2023   LDLCALC 106 (H) 03/13/2023   TRIG 70 03/13/2023   CHOLHDL 2.7 03/13/2023   Vitamin D -OH 32.7 in 02/2021   PMH, PSH, SH and FH reviewed    ROS:  The  patient denies anorexia, fever, recent weight changes, headaches,  vision changes, decreased hearing, ear pain, sore throat, breast concerns, chest pain, palpitations, syncope, dyspnea on exertion, cough, swelling, nausea, vomiting, heartburn, diarrhea, constipation, abdominal pain, melena, hematochezia, hematuria, dysuria, vaginal discharge, odor or itch, genital lesions, numbness, tingling, weakness, tremor, suspicious skin  lesions, depression, abnormal bleeding/bruising, or enlarged lymph nodes. More hot flashes, no night sweats.  Stress is better; moods are good. Insomnia per HPI Some snoring. Not refreshed in the mornings, feels sleepy around 2pm.      PHYSICAL EXAM:  There were no vitals taken for this visit.  Wt Readings from Last 3 Encounters:  09/18/23 189 lb 12.8 oz (86.1 kg)  03/13/23 198 lb (89.8 kg)  06/15/22 217 lb (98.4 kg)    General Appearance:    Alert, cooperative, no distress, appears stated age. Not changed into gown.  Head:    Normocephalic, without obvious abnormality, atraumatic  Eyes:    PERRL, conjunctiva/corneas clear, EOM's intact, fundi    benign  Ears:    Normal TM's and external ear canals  Nose:   Normal, no drainage  Throat:   Normal mucosa, no lesions  Neck:   Supple, no lymphadenopathy;  thyroid :  no enlargement/ tenderness/nodules; no carotid bruit or JVD.   Back:    Spine nontender, no curvature, ROM normal, no CVA     tenderness  Lungs:     Clear to auscultation bilaterally without wheezes, rales or     ronchi; respirations unlabored  Chest Wall:    Nontender   Heart:    Regular rate and rhythm, S1 and S2 normal, no murmur, rub   or gallop  Breast Exam:    Deferred to GYN  Abdomen:     Soft, non-tender, nondistended, normoactive bowel sounds,    no masses, no hepatosplenomegaly  Genitalia:    Deferred to GYN     Extremities:   No clubbing, cyanosis or edema.  Tender at bilateral trochanteric bursa, L>R Tender at L IT band, nontender on right.  Pulses:   2+ and symmetric all extremities  Skin:   Skin color, texture, turgor normal, no rashes or lesions  Lymph nodes:   Cervical, supraclavicular nodes normal  Neurologic:   CNII-XII intact, normal strength, sensation and gait; reflexes 2+ and symmetric throughout          Psych:   Normal mood, affect, hygiene and grooming.        09/18/2023   10:37 AM 03/13/2023    8:39 AM 03/03/2022    8:50 AM 01/25/2022     1:46 PM  GAD 7 : Generalized Anxiety Score  Nervous, Anxious, on Edge 0 0 0 1  Control/stop worrying 0 0 0 1  Worry too much - different things 0 0 0 1  Trouble relaxing 0 0 0 1  Restless 0 0 0 1  Easily annoyed or irritable 0 0 1 2  Afraid - awful might happen 0 0 0 2  Total GAD 7 Score 0 0 1 9  Anxiety Difficulty Not difficult at all Not difficult at all  Very difficult      09/18/2023   10:35 AM 03/13/2023    8:40 AM 03/03/2022    8:39 AM 01/25/2022    1:46 PM 03/01/2021    2:39 PM  Depression screen PHQ 2/9  Decreased Interest 0 0 0 0 0  Down, Depressed, Hopeless 0 0 0 0 0  PHQ - 2 Score 0 0 0 0  0  Altered sleeping  3     Tired, decreased energy  1     Change in appetite  0     Feeling bad or failure about yourself   0     Trouble concentrating  0     Moving slowly or fidgety/restless  0     Suicidal thoughts  0     PHQ-9 Score  4     Difficult doing work/chores  Not difficult at all        ASSESSMENT/PLAN:   Did she ever check into CPAP for OSA? (Last year said was too expensive) Did she quit smoking?  If still smoking, does she want to retry Chantix ?  Scheduled for mammo? (Was due in April) gad-7 only if reporting anxiety/insomnia  Doesn't need lipids again if no change to diet (normal last year)  Colonoscopy due in July, needs to schedule  Discussed monthly self breast exams and yearly mammograms (past due); at least 30 minutes of aerobic activity at least 5 days/week, weight-bearing exercise at least 2x/week; proper sunscreen use reviewed; healthy diet, including goals of calcium and vitamin D  intake and alcohol recommendations (less than or equal to 1 drink/day) reviewed; regular seatbelt use; changing batteries in smoke detectors.  Immunization recommendations discussed--yearly flu shots are recommended (pt declines these).  COVID boosters also declined.  Colonoscopy recommendations reviewed--UTD, due again 04/2024.

## 2024-03-25 ENCOUNTER — Encounter: Payer: Self-pay | Admitting: Family Medicine

## 2024-03-25 ENCOUNTER — Ambulatory Visit: Payer: 59 | Admitting: Family Medicine

## 2024-03-25 VITALS — BP 134/80 | HR 80 | Ht 64.0 in | Wt 194.4 lb

## 2024-03-25 DIAGNOSIS — G4733 Obstructive sleep apnea (adult) (pediatric): Secondary | ICD-10-CM | POA: Diagnosis not present

## 2024-03-25 DIAGNOSIS — N3281 Overactive bladder: Secondary | ICD-10-CM

## 2024-03-25 DIAGNOSIS — I10 Essential (primary) hypertension: Secondary | ICD-10-CM | POA: Diagnosis not present

## 2024-03-25 DIAGNOSIS — Z720 Tobacco use: Secondary | ICD-10-CM

## 2024-03-25 DIAGNOSIS — G4700 Insomnia, unspecified: Secondary | ICD-10-CM

## 2024-03-25 DIAGNOSIS — F419 Anxiety disorder, unspecified: Secondary | ICD-10-CM | POA: Diagnosis not present

## 2024-03-25 DIAGNOSIS — M7061 Trochanteric bursitis, right hip: Secondary | ICD-10-CM | POA: Insufficient documentation

## 2024-03-25 DIAGNOSIS — Z0000 Encounter for general adult medical examination without abnormal findings: Secondary | ICD-10-CM | POA: Diagnosis not present

## 2024-03-25 DIAGNOSIS — M7062 Trochanteric bursitis, left hip: Secondary | ICD-10-CM

## 2024-03-25 MED ORDER — VARENICLINE TARTRATE 1 MG PO TABS: 1.0000 mg | ORAL_TABLET | Freq: Two times a day (BID) | ORAL | 1 refills | Status: DC

## 2024-03-25 MED ORDER — SOLIFENACIN SUCCINATE 10 MG PO TABS
10.0000 mg | ORAL_TABLET | Freq: Every day | ORAL | 3 refills | Status: AC
Start: 2024-03-25 — End: ?

## 2024-03-25 MED ORDER — MELOXICAM 15 MG PO TABS
15.0000 mg | ORAL_TABLET | Freq: Every day | ORAL | 0 refills | Status: DC
Start: 2024-03-25 — End: 2024-07-03

## 2024-03-25 MED ORDER — VARENICLINE TARTRATE (STARTER) 0.5 MG X 11 & 1 MG X 42 PO TBPK: ORAL_TABLET | ORAL | 0 refills | Status: DC

## 2024-03-25 NOTE — Assessment & Plan Note (Signed)
 Stopped amlodipine  5 mg on her own >1 month ago. Per her report, BP's at home aren't bad--some are high others are good. She will continue to monitor, record values, and send to us  if persistently >135-140/85-90.  Reviewed low Na diet, regular exercise, weight loss.  If BP's consistently above goal, can restart amlodipine  at 1/2 tablet (and increase to the full tablet if still above goal).  We reviewed risks for HTN, and that sometimes there is a hereditary component.  Reviewed CV risk factors, including smoking and HBP.  Encouraged her to communicate with us  regarding concerns about meds and BP's. Can stay off the amlodipine  for now and monitor.

## 2024-03-25 NOTE — Assessment & Plan Note (Signed)
 Reviewed risks of untreated OSA, causes, and that weight loss might not completely resolve.  She doesn't think she could handle CPAP. She has appt this week with dentist. Discussed risks/benefits of oral appliance (not well covered by insurance, may not work, may cause some jaw pain/discomfort, etc).  She will also check with her insurance again re: OOP costs for CPAP.

## 2024-03-25 NOTE — Assessment & Plan Note (Signed)
 Reviewed risks/SE of Chantix , to set quit date for 2 weeks after starting, and rec to complete 3 month course of Chantix . Discussed strategies--encouraged quitting alcohol (worsens insomnia and smokes when drinking, also for weight loss).  Discussed potential triggers. Consider [place]Quitline.com for coaching.  Rec for her husband as well to help.

## 2024-03-25 NOTE — Assessment & Plan Note (Signed)
 Some racing thoughts at night, not really improved with the buspar .  Sleep is interrupted due to hip pain (and alcohol doesn't help).  ?benefit from nightly use. Can see if any difference without, vs if she is truly getting benefit. Can take up to BID if increased stress/anxiety.

## 2024-03-25 NOTE — Assessment & Plan Note (Signed)
 Overall doing well on vesicare .  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 Discussed Kegel's and other behavioral techniques.

## 2024-03-25 NOTE — Assessment & Plan Note (Signed)
 Given that she also has some pain extending to upper IT bands, will treat with course of NSAID rather than injections.  Given home exercises.  Risks/SE of NSAIDs reviewed. F/u for cortisone injections if not improving. Consider PT referral if not improving.

## 2024-03-25 NOTE — Assessment & Plan Note (Signed)
 Has trouble staying asleep.  Hip pain interferes with her sleep. Discussed role of alcohol and sleep disruptions, encouraged cutting back/avoiding.  She hasn't been taking trazodone , using buspar  nightly instead (?benefit). Will treat bursitis/pain with NSAIDs. Can use Trazodone  prn. On HRT--menopausal symptoms are not contributing.

## 2024-03-26 ENCOUNTER — Ambulatory Visit: Payer: Self-pay | Admitting: Family Medicine

## 2024-03-26 LAB — CBC WITH DIFFERENTIAL/PLATELET
Basophils Absolute: 0 10*3/uL (ref 0.0–0.2)
Basos: 0 %
EOS (ABSOLUTE): 0.1 10*3/uL (ref 0.0–0.4)
Eos: 1 %
Hematocrit: 43.3 % (ref 34.0–46.6)
Hemoglobin: 14.3 g/dL (ref 11.1–15.9)
Immature Grans (Abs): 0 10*3/uL (ref 0.0–0.1)
Immature Granulocytes: 0 %
Lymphocytes Absolute: 1.7 10*3/uL (ref 0.7–3.1)
Lymphs: 25 %
MCH: 33.4 pg — ABNORMAL HIGH (ref 26.6–33.0)
MCHC: 33 g/dL (ref 31.5–35.7)
MCV: 101 fL — ABNORMAL HIGH (ref 79–97)
Monocytes Absolute: 0.6 10*3/uL (ref 0.1–0.9)
Monocytes: 9 %
Neutrophils Absolute: 4.3 10*3/uL (ref 1.4–7.0)
Neutrophils: 65 %
Platelets: 231 10*3/uL (ref 150–450)
RBC: 4.28 x10E6/uL (ref 3.77–5.28)
RDW: 11.8 % (ref 11.7–15.4)
WBC: 6.8 10*3/uL (ref 3.4–10.8)

## 2024-03-26 LAB — COMPREHENSIVE METABOLIC PANEL WITH GFR
ALT: 12 IU/L (ref 0–32)
AST: 16 IU/L (ref 0–40)
Albumin: 4.2 g/dL (ref 3.8–4.9)
Alkaline Phosphatase: 92 IU/L (ref 44–121)
BUN/Creatinine Ratio: 15 (ref 9–23)
BUN: 10 mg/dL (ref 6–24)
Bilirubin Total: 0.5 mg/dL (ref 0.0–1.2)
CO2: 21 mmol/L (ref 20–29)
Calcium: 9 mg/dL (ref 8.7–10.2)
Chloride: 105 mmol/L (ref 96–106)
Creatinine, Ser: 0.65 mg/dL (ref 0.57–1.00)
Globulin, Total: 2.3 g/dL (ref 1.5–4.5)
Glucose: 85 mg/dL (ref 70–99)
Potassium: 4.2 mmol/L (ref 3.5–5.2)
Sodium: 141 mmol/L (ref 134–144)
Total Protein: 6.5 g/dL (ref 6.0–8.5)
eGFR: 103 mL/min/{1.73_m2} (ref >59)

## 2024-03-26 LAB — LIPID PANEL
Chol/HDL Ratio: 3.2 ratio (ref 0.0–4.4)
Cholesterol, Total: 199 mg/dL (ref 100–199)
HDL: 63 mg/dL (ref >39)
LDL Chol Calc (NIH): 121 mg/dL — ABNORMAL HIGH (ref 0–99)
Triglycerides: 83 mg/dL (ref 0–149)
VLDL Cholesterol Cal: 15 mg/dL (ref 5–40)

## 2024-04-02 ENCOUNTER — Other Ambulatory Visit: Payer: Self-pay | Admitting: Obstetrics and Gynecology

## 2024-04-02 DIAGNOSIS — Z1231 Encounter for screening mammogram for malignant neoplasm of breast: Secondary | ICD-10-CM

## 2024-04-03 ENCOUNTER — Ambulatory Visit
Admission: RE | Admit: 2024-04-03 | Discharge: 2024-04-03 | Discharge status: Home / Self Care | Source: Ambulatory Visit | Attending: Obstetrics and Gynecology | Admitting: Obstetrics and Gynecology

## 2024-04-03 DIAGNOSIS — Z1231 Encounter for screening mammogram for malignant neoplasm of breast: Secondary | ICD-10-CM

## 2024-04-17 ENCOUNTER — Other Ambulatory Visit: Payer: Self-pay | Admitting: *Deleted

## 2024-04-17 ENCOUNTER — Other Ambulatory Visit: Payer: Self-pay | Admitting: Family Medicine

## 2024-04-17 DIAGNOSIS — Z720 Tobacco use: Secondary | ICD-10-CM

## 2024-05-10 ENCOUNTER — Other Ambulatory Visit: Payer: Self-pay | Admitting: Family Medicine

## 2024-05-10 DIAGNOSIS — I10 Essential (primary) hypertension: Secondary | ICD-10-CM

## 2024-06-07 ENCOUNTER — Other Ambulatory Visit: Payer: Self-pay | Admitting: Family Medicine

## 2024-06-07 DIAGNOSIS — Z720 Tobacco use: Secondary | ICD-10-CM

## 2024-06-14 ENCOUNTER — Ambulatory Visit (AMBULATORY_SURGERY_CENTER)

## 2024-06-14 ENCOUNTER — Encounter: Payer: Self-pay | Admitting: Internal Medicine

## 2024-06-14 VITALS — Ht 64.0 in | Wt 196.0 lb

## 2024-06-14 DIAGNOSIS — Z800 Family history of malignant neoplasm of digestive organs: Secondary | ICD-10-CM

## 2024-06-14 DIAGNOSIS — Z860100 Personal history of colon polyps, unspecified: Secondary | ICD-10-CM

## 2024-06-14 MED ORDER — NA SULFATE-K SULFATE-MG SULF 17.5-3.13-1.6 GM/177ML PO SOLN: 1.0000 | Freq: Once | ORAL | 0 refills | Status: AC

## 2024-06-14 NOTE — Progress Notes (Signed)
 No issues known to pt with past sedation with any surgeries or procedures Patient denies ever being told they had issues or difficulty with intubation  No FH of Malignant Hyperthermia Pt is not on diet pills nor GLP-1 medications Pt is not on home 02  Pt is not on blood thinners  Pt states occasional issues with chronic constipation  No A fib or A flutter Have any cardiac testing pending--no Pt instructed to use [email or web address] or GoodRx for a price reduction on prep

## 2024-06-17 ENCOUNTER — Encounter

## 2024-06-30 NOTE — Progress Notes (Unsigned)
 [facility] History and Physical   Primary Care Physician:  Randol Dawes, MD   Reason for Procedure:  History of colon polyps  Plan:    Colonoscopy     HPI: Jane Valdez is a 58 y.o. female with a history of colon polyps as outlined below, here for surveillance colonoscopy examination.  Pediatric colonoscope was used on last examination.  09/2017 10 mm adenoma  04/23/2021 4 polyps max 7 mm - adenoma, TV adenoma, SSP and hyperplastic recall 2025 Past Medical History:  Diagnosis Date   Allergy    seasonal   Anxiety    no meds   Depression    no meds   Frequent headaches    GERD (gastroesophageal reflux disease)    diet controlled, no meds    Hx of adenomatous polyp of colon 10/10/2017   Lupus    discoid lupus (Dr. Ivin)   Sleep apnea    SVD (spontaneous vaginal delivery)    x 2   Urinary incontinence     Past Surgical History:  Procedure Laterality Date   ANKLE SURGERY Left    BLADDER SUSPENSION N/A 10/09/2018   Procedure: TRANSVAGINAL TAPE (TVT) PROCEDURE exact mid-urethral sling;  Surgeon: Cathlyn JAYSON Nikki Bobie FORBES, MD;  Location: [facility];  Service: Gynecology;  Laterality: N/A;   CHOLECYSTECTOMY     COLONOSCOPY  2018   CYSTOCELE REPAIR N/A 10/09/2018   Procedure: ANTERIOR REPAIR (CYSTOCELE);  Surgeon: Cathlyn JAYSON Nikki Bobie FORBES, MD;  Location: [facility];  Service: Gynecology;  Laterality: N/A;   CYSTOSCOPY N/A 10/09/2018   Procedure: CYSTOSCOPY;  Surgeon: Cathlyn JAYSON Nikki Bobie FORBES, MD;  Location: [facility];  Service: Gynecology;  Laterality: N/A;   EYE SURGERY Bilateral    Lasik   FOOT SURGERY Left    MOUTH SURGERY     deep clean at gum line   WISDOM TOOTH EXTRACTION       Current Outpatient Medications  Medication Sig Dispense Refill   amLODipine  (NORVASC ) 5 MG tablet TAKE 1 TABLET (5 MG TOTAL) BY MOUTH DAILY. 30 tablet 1   progesterone (PROMETRIUM) 100 MG capsule Take 100 mg by mouth at bedtime.     solifenacin  (VESICARE ) 10 MG tablet Take 1  tablet (10 mg total) by mouth daily. 90 tablet 3   busPIRone  (BUSPAR ) 5 MG tablet TAKE 1 TABLET BY MOUTH TWICE A DAY 60 tablet 2   estradiol  (VIVELLE -DOT) 0.0375 MG/24HR Place 1 patch onto the skin 2 (two) times a week.     meloxicam  (MOBIC ) 15 MG tablet Take 1 tablet (15 mg total) by mouth daily. Take with food, daily until your pain has resolved (Patient not taking: Reported on 06/14/2024) 15 tablet 0   Moringa Oleifera (MORINGA PO) Take 2 capsules by mouth in the morning. (Patient not taking: Reported on 06/14/2024)     Multiple Vitamin (MULTIVITAMIN PO) Take 2 each by mouth daily.     traZODone  (DESYREL ) 100 MG tablet Take 1-1.5 tablets (100-150 mg total) by mouth at bedtime as needed for sleep. (Patient not taking: Reported on 06/14/2024) 45 tablet 6   varenicline  (CHANTIX ) 1 MG tablet TAKE 1 TABLET BY MOUTH TWICE A DAY 60 tablet 1   Varenicline  Tartrate, Starter, (CHANTIX  STARTING MONTH PAK) 0.5 MG X 11 & 1 MG X 42 TBPK Take as directed 53 each 0   Current Facility-Administered Medications  Medication Dose Route Frequency Provider Last Rate Last Admin   0.9 %  sodium chloride  infusion  500 mL Intravenous  Once Avram Lupita BRAVO, MD        Allergies as of 07/01/2024   (No Known Allergies)    Family History  Problem Relation Age of Onset   Hypertension Mother    Hyperlipidemia Father    Heart disease Father    Hypertension Father    Thyroid  disease Sister    Depression Sister    Stroke Maternal Grandmother    Diabetes Paternal Grandmother    Breast cancer Maternal Aunt        age 75's or later   Colon cancer Maternal Aunt    Cancer Maternal Aunt    Breast cancer Maternal Aunt        age 82's or later   Breast cancer Maternal Aunt        age 63's or late   Colon polyps Neg Hx    Rectal cancer Neg Hx    Stomach cancer Neg Hx    Esophageal cancer Neg Hx     Social History   Socioeconomic History   Marital status: Married    Spouse name: Not on file   Number of children: Not  on file   Years of education: Not on file   Highest education level: Not on file  Occupational History   Not on file  Tobacco Use   Smoking status: Former    Current packs/day: 0.50    Types: Cigarettes    Passive exposure: Current   Smokeless tobacco: Never   Tobacco comments:    Quit for 2 mos with Chantix , back to smoking 10/d  Vaping Use   Vaping status: Never Used  Substance and Sexual Activity   Alcohol use: Yes    Alcohol/week: 7.0 standard drinks of alcohol    Types: 7 Glasses of wine per week    Comment: glass of wine about 7 days/week   Drug use: No   Sexual activity: Yes    Partners: Male  Other Topics Concern   Not on file  Social History Narrative   Married, 2 children, live locally. No pets.   Works HR at [employer]      Updated 02/2024   Social Drivers of Health   Financial Resource Strain: Not on file  Food Insecurity: Not on file  Transportation Needs: Not on file  Physical Activity: Not on file  Stress: Not on file  Social Connections: Not on file  Intimate Partner Violence: Not on file    Review of Systems:  All other review of systems negative except as mentioned in the HPI.  Physical Exam: Vital signs BP 130/84   Pulse 80   Temp 97.9 F (36.6 C)   Ht 5' 4 (1.626 m)   Wt 196 lb (88.9 kg)   SpO2 99%   BMI 33.64 kg/m   General:   Alert,  Well-developed, well-nourished, pleasant and cooperative in NAD Lungs:  Clear throughout to auscultation.   Heart:  Regular rate and rhythm; no murmurs, clicks, rubs,  or gallops. Abdomen:  Soft, nontender and nondistended. Normal bowel sounds.   Neuro/Psych:  Alert and cooperative. Normal mood and affect. A and O x 3   @Anees Vanecek  CHARLENA Avram, MD, [facility] [phone] (pager) 07/01/2024 9:10 AM@

## 2024-07-01 ENCOUNTER — Encounter: Payer: Self-pay | Admitting: Internal Medicine

## 2024-07-01 ENCOUNTER — Other Ambulatory Visit: Payer: Self-pay | Admitting: Family Medicine

## 2024-07-01 ENCOUNTER — Ambulatory Visit: Admitting: Internal Medicine

## 2024-07-01 VITALS — BP 122/76 | HR 62 | Temp 97.9°F | Resp 11 | Ht 64.0 in | Wt 196.0 lb

## 2024-07-01 DIAGNOSIS — D124 Benign neoplasm of descending colon: Secondary | ICD-10-CM | POA: Diagnosis not present

## 2024-07-01 DIAGNOSIS — Z720 Tobacco use: Secondary | ICD-10-CM

## 2024-07-01 DIAGNOSIS — Z1211 Encounter for screening for malignant neoplasm of colon: Secondary | ICD-10-CM | POA: Diagnosis present

## 2024-07-01 DIAGNOSIS — Z860100 Personal history of colon polyps, unspecified: Secondary | ICD-10-CM

## 2024-07-01 DIAGNOSIS — Z860101 Personal history of adenomatous and serrated colon polyps: Secondary | ICD-10-CM | POA: Diagnosis not present

## 2024-07-01 MED ORDER — SODIUM CHLORIDE 0.9 % IV SOLN: 500.0000 mL | Freq: Once | INTRAVENOUS | Status: DC

## 2024-07-01 NOTE — Op Note (Signed)
 [facility] Patient Name: Jane Valdez Procedure Date: 07/01/2024 9:01 AM MRN: [id number] Endoscopist: Lupita FORBES Commander , MD, [id number] Age: 58 Referring MD:  Date of Birth: Dec 26, 1965 Gender: Female Account #: [id number] Procedure:                Colonoscopy Indications:              Surveillance: Personal history of adenomatous                            polyps on last colonoscopy 3 years ago, Last                            colonoscopy: 2022 Medicines:                Monitored Anesthesia Care Procedure:                Pre-Anesthesia Assessment:                           - Prior to the procedure, a History and Physical                            was performed, and patient medications and                            allergies were reviewed. The patient's tolerance of                            previous anesthesia was also reviewed. The risks                            and benefits of the procedure and the sedation                            options and risks were discussed with the patient.                            All questions were answered, and informed consent                            was obtained. Prior Anticoagulants: The patient has                            taken no anticoagulant or antiplatelet agents. ASA                            Grade Assessment: II - A patient with mild systemic                            disease. After reviewing the risks and benefits,                            the patient was deemed in satisfactory condition to  undergo the procedure.                           After obtaining informed consent, the colonoscope                            was passed under direct vision. Throughout the                            procedure, the patient's blood pressure, pulse, and                            oxygen saturations were monitored continuously. The                            PCF-HQ190L Colonoscope [id number] was introduced                             through the anus and advanced to the the cecum,                            identified by appendiceal orifice and ileocecal                            valve. The colonoscopy was performed without                            difficulty. The patient tolerated the procedure                            well. The quality of the bowel preparation was                            good. The ileocecal valve, appendiceal orifice, and                            rectum were photographed. The bowel preparation                            used was SUPREP via split dose instruction. Scope In: 9:17:34 AM Scope Out: 9:34:20 AM Scope Withdrawal Time: 0 hours 12 minutes 26 seconds  Total Procedure Duration: 0 hours 16 minutes 46 seconds  Findings:                 The perianal and digital rectal examinations were                            normal.                           A 5 mm polyp was found in the proximal descending                            colon. The polyp was sessile. The polyp was removed  with a cold snare. Resection and retrieval were                            complete. Verification of patient identification                            for the specimen was done. Estimated blood loss was                            minimal.                           The exam was otherwise without abnormality on                            direct and retroflexion views. Complications:            No immediate complications. Estimated Blood Loss:     Estimated blood loss was minimal. Impression:               - One 5 mm polyp in the proximal descending colon,                            removed with a cold snare. Resected and retrieved.                           - The examination was otherwise normal on direct                            and retroflexion views.                           - Personal history of colonic polyps. 09/2017 10 mm                            adenoma                            04/23/2021 4 polyps max 7 mm - adenoma, TV adenoma,                            SSP and hyperplastic Recommendation:           - Patient has a contact number available for                            emergencies. The signs and symptoms of potential                            delayed complications were discussed with the                            patient. Return to normal activities tomorrow.                            Written discharge instructions were provided  to the                            patient.                           - Resume previous diet.                           - Continue present medications.                           - Await pathology results.                           - Repeat colonoscopy in 5 years for surveillance. Lupita FORBES Commander, MD 07/01/2024 9:44:08 AM This report has been signed electronically. Addendum Number: 1   Addendum Date: 07/03/2024 4:05:04 PM      Polyp was traditional serrated adenoma so colonoscopy recall in 3 years Lupita FORBES Commander, MD 07/03/2024 4:05:26 PM This report has been signed electronically.

## 2024-07-01 NOTE — Progress Notes (Signed)
 Pt A/O x 3, gd SR's, pleased with anesthesia, report to RN

## 2024-07-01 NOTE — Progress Notes (Signed)
 Pt's states no medical or surgical changes since previsit or office visit.

## 2024-07-01 NOTE — Patient Instructions (Addendum)
 I found and removed 1 small polyp. It looks benign - will have it analyzed.  Your next routine colonoscopy should be in 5 years - 2030.  I appreciate the opportunity to care for you. Lupita CHARLENA Commander, MD, FACG  YOU HAD AN ENDOSCOPIC PROCEDURE TODAY AT THE [facility]:   Refer to the procedure report that was given to you for any specific questions about what was found during the examination.  If the procedure report does not answer your questions, please call your gastroenterologist to clarify.  If you requested that your care partner not be given the details of your procedure findings, then the procedure report has been included in a sealed envelope for you to review at your convenience later.  YOU SHOULD EXPECT: Some feelings of bloating in the abdomen. Passage of more gas than usual.  Walking can help get rid of the air that was put into your GI tract during the procedure and reduce the bloating. If you had a lower endoscopy (such as a colonoscopy or flexible sigmoidoscopy) you may notice spotting of blood in your stool or on the toilet paper. If you underwent a bowel prep for your procedure, you may not have a normal bowel movement for a few days.  Please Note:  You might notice some irritation and congestion in your nose or some drainage.  This is from the oxygen used during your procedure.  There is no need for concern and it should clear up in a day or so.  SYMPTOMS TO REPORT IMMEDIATELY:  Following lower endoscopy (colonoscopy or flexible sigmoidoscopy):  Excessive amounts of blood in the stool  Significant tenderness or worsening of abdominal pains  Swelling of the abdomen that is new, acute  Fever of 100F or higher  For urgent or emergent issues, a gastroenterologist can be reached at any hour by calling [phone]. Do not use MyChart messaging for urgent concerns.    DIET:  We do recommend a small meal at first, but then you may proceed to your regular diet.   Drink plenty of fluids but you should avoid alcoholic beverages for 24 hours.  ACTIVITY:  You should plan to take it easy for the rest of today and you should NOT DRIVE or use heavy machinery until tomorrow (because of the sedation medicines used during the test).    FOLLOW UP: Our staff will call the number listed on your records the next business day following your procedure.  We will call around 7:15- 8:00 am to check on you and address any questions or concerns that you may have regarding the information given to you following your procedure. If we do not reach you, we will leave a message.     If any biopsies were taken you will be contacted by phone or by letter within the next 1-3 weeks.  Please call us  at [phone] if you have not heard about the biopsies in 3 weeks.    SIGNATURES/CONFIDENTIALITY: You and/or your care partner have signed paperwork which will be entered into your electronic medical record.  These signatures attest to the fact that that the information above on your After Visit Summary has been reviewed and is understood.  Full responsibility of the confidentiality of this discharge information lies with you and/or your care-partner.

## 2024-07-01 NOTE — Progress Notes (Signed)
 Called to room to assist during endoscopic procedure.  Patient ID and intended procedure confirmed with present staff. Received instructions for my participation in the procedure from the performing physician.

## 2024-07-02 ENCOUNTER — Telehealth: Payer: Self-pay | Admitting: *Deleted

## 2024-07-02 NOTE — Progress Notes (Unsigned)
 No chief complaint on file.  Patient presents for 3 month f/u on multiple issues.  Hypertension: At her physical in June she reported she had stopped amlodipine  5mg   >1 month prior.  She reported BP's were pretty good off the medication. She was advised to monitor BP regularly and contact us  if they were above goal, and to restart at 1/2-1 pill.  BP's have been running ***  Taking amlodipine ??   Anxiety and insomnia:  She had reported some racing thoughts at night, not really better with buspar .  She had been taking Buspar  nightly at bedtime, and not using any trazodone . Had discussed whether or not she was getting benefit from buspar , could try stopping, and could increase to BID if needed for increased stress/anxiety. We had discussed using Trazodone  prn for insomnia. She was having trouble staying asleep.  Hip pain contributed to sleep interruptions (see below).  We discussed impact of alcohol on sleep as well. She is on HRT and hot flashes were not a factor with her insomnia.  Today she reports  ***  ***taking Buspar ? Trazodone ? Alcohol intake? ***   Bilateral trochanteric bursitis, with some pain extending to upper IT bands. She was prescribed a course of meloxicam , given home exercises.  We had discussed PT and cortisone injections as possible options.  Her hip pain was contributing to sleep interruptions. Today she reports ***  OSA: severe, noted on sleep study 03/2023.  She didn't like the idea of CPAP, had wanted to discuss oral appliance with her dentist. She had also been advised to check with her insurance re: OOP cost for CPAP.  ***UPDATE--oral appliance from dentist??  Tobacco use:  She was prescribed a 3 month course of Chantix  to help with cessation. Her husband was also trying to quit. ***UPDATE    Obesity: We previously discussed diet, exercise, encouraged cutting back on wine intake, and use of tracking apps vs WW vs other ways to hold herself accountable re:  diet/exercise. She had done low dose semaglutide with [facility] without much difference and was too expensive.  She had been losing weight following a low carb diet, with 1 cheat day/week (Sunday)-- has some carbs, usually pasta. At her physical she reported no longer doing low carb diet. She was eating pasta, bread, potatoes often, and having a glass of wine daily. Today she reports ***    PMH, PSH, SH reviewed   ROS:    PHYSICAL EXAM:  There were no vitals taken for this visit.  Wt Readings from Last 3 Encounters:  07/01/24 196 lb (88.9 kg)  06/14/24 196 lb (88.9 kg)  03/25/24 194 lb 6.4 oz (88.2 kg)       ASSESSMENT/PLAN:  Flu shot Prevnar-20   Quit smoking with Chantix ? Did she complete 3 mos? Using trazodone ? Buspar ? Did she get oral appliance from dentist or willing to try CPAP for OSA?  Alcohol intake--cut back?? Hip pain affecting sleep?

## 2024-07-02 NOTE — Telephone Encounter (Signed)
  Follow up Call-     07/01/2024    8:19 AM  Call back number  Post procedure Call Back phone  # [phone]  Permission to leave phone message Yes     Patient questions:  Do you have a fever, pain , or abdominal swelling? No. Pain Score  0 *  Have you tolerated food without any problems? Yes.    Have you been able to return to your normal activities? Yes.    Do you have any questions about your discharge instructions: Diet   No. Medications  No. Follow up visit  No.  Do you have questions or concerns about your Care? No.  Actions: * If pain score is 4 or above: No action needed, pain <4.

## 2024-07-03 ENCOUNTER — Ambulatory Visit: Payer: Self-pay | Admitting: Internal Medicine

## 2024-07-03 ENCOUNTER — Encounter: Payer: Self-pay | Admitting: Family Medicine

## 2024-07-03 ENCOUNTER — Ambulatory Visit: Admitting: Family Medicine

## 2024-07-03 VITALS — BP 128/78 | HR 72 | Ht 64.0 in | Wt 198.0 lb

## 2024-07-03 DIAGNOSIS — E66811 Obesity, class 1: Secondary | ICD-10-CM

## 2024-07-03 DIAGNOSIS — Z6833 Body mass index (BMI) 33.0-33.9, adult: Secondary | ICD-10-CM

## 2024-07-03 DIAGNOSIS — Z720 Tobacco use: Secondary | ICD-10-CM

## 2024-07-03 DIAGNOSIS — M7062 Trochanteric bursitis, left hip: Secondary | ICD-10-CM

## 2024-07-03 DIAGNOSIS — M7061 Trochanteric bursitis, right hip: Secondary | ICD-10-CM | POA: Diagnosis not present

## 2024-07-03 DIAGNOSIS — I10 Essential (primary) hypertension: Secondary | ICD-10-CM | POA: Diagnosis not present

## 2024-07-03 DIAGNOSIS — G4733 Obstructive sleep apnea (adult) (pediatric): Secondary | ICD-10-CM | POA: Diagnosis not present

## 2024-07-03 DIAGNOSIS — Z23 Encounter for immunization: Secondary | ICD-10-CM

## 2024-07-03 DIAGNOSIS — E6609 Other obesity due to excess calories: Secondary | ICD-10-CM

## 2024-07-03 LAB — SURGICAL PATHOLOGY

## 2024-07-03 MED ORDER — CYCLOBENZAPRINE HCL 10 MG PO TABS: 5.0000 mg | ORAL_TABLET | Freq: Every evening | ORAL | 0 refills | Status: AC | PRN

## 2024-07-03 NOTE — Patient Instructions (Addendum)
 Dr. Nena Riding is a dentist who does the oral appliances to treat sleep apnea. Other option we discussed was to check back with your insurance to see what your out of pocket cost would be.  We discussed the GLP-1 medications for weight loss, having the indication to treat sleep apnea.  Consider using nicotine gum/lozenges or patches if you are struggling once you complete the course of Chantix .  If desire is strong now, you can bump back up to taking it twice a day until you complete the course. Consider coaching through [email or web address] or [employer]modationsBlog[employer][email or web address].  Take 2 Aleve with food, twice daily for 2 weeks (can stop sooner if your hip pain completely resolves). Take 1/2 - 1 flexeril  at bedtime. This is a muscle relaxant that should help with the deep buttock pain, and also help sleep. Don't take trazodone  while taking the muscle relaxant.  Return for bursa injections if pain persists--this only helps the pain at the portion of the hip that I showed you, not the pain down the leg or into the buttock muscles. Do the IT band stretches regularly..  We discussed the recommendation of Prevnar-20 (a different pneumonia shot than you had 2 years ago; you only need this once in your life). You can schedule a nurse visit for this or get it from the pharmacy, or at a future visit here.

## 2024-07-04 ENCOUNTER — Other Ambulatory Visit: Payer: Self-pay | Admitting: Family Medicine

## 2024-07-04 ENCOUNTER — Encounter: Payer: Self-pay | Admitting: Family Medicine

## 2024-07-04 DIAGNOSIS — E6609 Other obesity due to excess calories: Secondary | ICD-10-CM

## 2024-07-04 DIAGNOSIS — G4733 Obstructive sleep apnea (adult) (pediatric): Secondary | ICD-10-CM

## 2024-07-04 MED ORDER — ZEPBOUND 2.5 MG/0.5ML ~~LOC~~ SOAJ: 2.5000 mg | SUBCUTANEOUS | 0 refills | Status: DC

## 2024-07-04 NOTE — Addendum Note (Signed)
 Addended by: Solimar Maiden on: 07/04/2024 08:34 AM   Modules accepted: Orders

## 2024-07-04 NOTE — Telephone Encounter (Signed)
 Can someone assist please? Thanks.

## 2024-07-05 ENCOUNTER — Other Ambulatory Visit: Payer: Self-pay | Admitting: Family Medicine

## 2024-07-05 DIAGNOSIS — I10 Essential (primary) hypertension: Secondary | ICD-10-CM

## 2024-07-08 ENCOUNTER — Telehealth: Payer: Self-pay

## 2024-07-08 ENCOUNTER — Other Ambulatory Visit (HOSPITAL_COMMUNITY): Payer: Self-pay

## 2024-07-08 ENCOUNTER — Other Ambulatory Visit: Payer: Self-pay | Admitting: Medical

## 2024-07-08 NOTE — Telephone Encounter (Signed)
 Pharmacy Patient Advocate Encounter   Received notification from RX Request Messages that prior authorization for tirzepatide  (ZEPBOUND ) 2.5 MG/0.5ML Pen  is required/requested.   Insurance verification completed.   The patient is insured through [employer] .   Per test claim: PA required; PA submitted to above mentioned insurance via Latent Key/confirmation #/EOC [id number]   Status is pending

## 2024-07-08 NOTE — Telephone Encounter (Signed)
 Pharmacy Patient Advocate Encounter  Received notification from [employer] that Prior Authorization for Zepbound  2.5MG /0.5ML pen-injectors  has been DENIED.  Full denial letter will be uploaded to the media tab. See denial reason below.  Pt must have trialed CPAP and/or BiPAP therapy before Ins will cover Rx Zepbound .Please see below      PA #/Case ID/Reference #: [id number]

## 2024-07-08 NOTE — Telephone Encounter (Signed)
 Jane Valdez--please advise pt that the Zepbound  wasn't covered (apparently you have to have symptoms of sleep apnea despite using CPAP to get it covered?!) She needs to pursue treatment for sleep apnea as we discussed (CPAP vs I had given her Dr. Wayna name for oral appliance).  Also--it looks like you linked a refill for empagliflozin for one of Jane Valdez's patients to her??  Seen on Chart review--this is an error, she isn't on this.  Sending to Jane Valdez as Jane Valdez (I was surprised to see that it required failure to respond to CPAP for coverage).

## 2024-07-08 NOTE — Telephone Encounter (Signed)
 Patient advised.

## 2024-07-29 ENCOUNTER — Other Ambulatory Visit: Payer: Self-pay | Admitting: Family Medicine

## 2024-07-29 DIAGNOSIS — Z720 Tobacco use: Secondary | ICD-10-CM

## 2024-07-29 NOTE — Telephone Encounter (Signed)
 This change is not appropriate. Last rx'd 8/15 for #60 with 1 RF (after taking the starter kit), which is for a full 3 month course. She was only taking one daily, not BID as directed. Likely even had a refill left that she should be able to pick up (for #60).

## 2024-07-29 NOTE — Telephone Encounter (Signed)
 Is this okay to refill?

## 2024-07-29 NOTE — Telephone Encounter (Signed)
 Patient does not need.

## 2024-08-02 ENCOUNTER — Other Ambulatory Visit: Payer: Self-pay | Admitting: Family Medicine

## 2024-08-02 DIAGNOSIS — Z720 Tobacco use: Secondary | ICD-10-CM

## 2024-08-02 NOTE — Telephone Encounter (Signed)
 Lucienne wrote on last refill on 10/6 pt does not need medication

## 2024-09-18 ENCOUNTER — Telehealth: Admitting: Physician Assistant

## 2024-09-18 DIAGNOSIS — B9789 Other viral agents as the cause of diseases classified elsewhere: Secondary | ICD-10-CM

## 2024-09-18 DIAGNOSIS — J019 Acute sinusitis, unspecified: Secondary | ICD-10-CM | POA: Diagnosis not present

## 2024-09-18 MED ORDER — PREDNISONE 10 MG (21) PO TBPK: ORAL_TABLET | ORAL | 0 refills | Status: DC

## 2024-09-18 MED ORDER — IPRATROPIUM BROMIDE 0.03 % NA SOLN: 2.0000 | Freq: Two times a day (BID) | NASAL | 0 refills | Status: DC

## 2024-09-18 NOTE — Progress Notes (Signed)
 We are sorry that you are not feeling well.  Here is how we plan to help!  Based on what you have shared with me it looks like you have sinusitis.  Sinusitis is inflammation and infection in the sinus cavities of the head.  Based on your presentation I believe you most likely have Acute Viral Sinusitis.This is an infection most likely caused by a virus. There is not specific treatment for viral sinusitis other than to help you with the symptoms until the infection runs its course.  You may use an oral decongestant such as Mucinex D or if you have glaucoma or high blood pressure use plain Mucinex. Saline nasal spray help and can safely be used as often as needed for congestion, I have prescribed: Ipratropium Bromide  nasal spray 0.03% 2 sprays in eah nostril 2-3 times a day.  I have also sent in a steroid pack to take as directed to reduce sinus inflammation and hopefully speed recovery.  Some authorities believe that zinc sprays or the use of Echinacea may shorten the course of your symptoms.  Sinus infections are not as easily transmitted as other respiratory infection, however we still recommend that you avoid close contact with loved ones, especially the very young and elderly.  Remember to wash your hands thoroughly throughout the day as this is the number one way to prevent the spread of infection!  Home Care: Only take medications as instructed by your medical team. Do not take these medications with alcohol. A steam or ultrasonic humidifier can help congestion.  You can place a towel over your head and breathe in the steam from hot water  coming from a faucet. Avoid close contacts especially the very young and the elderly. Cover your mouth when you cough or sneeze. Always remember to wash your hands.  Get Help Right Away If: You develop worsening fever or sinus pain. You develop a severe head ache or visual changes. Your symptoms persist after you have completed your treatment plan.  Make  sure you Understand these instructions. Will watch your condition. Will get help right away if you are not doing well or get worse.  Your e-visit answers were reviewed by a board certified advanced clinical practitioner to complete your personal care plan.  Depending on the condition, your plan could have included both over the counter or prescription medications.  If there is a problem please reply  once you have received a response from your provider.  Your safety is important to us .  If you have drug allergies check your prescription carefully.    You can use MyChart to ask questions about today's visit, request a non-urgent call back, or ask for a work or school excuse for 24 hours related to this e-Visit. If it has been greater than 24 hours you will need to follow up with your provider, or enter a new e-Visit to address those concerns.  You will get an e-mail in the next two days asking about your experience.  I hope that your e-visit has been valuable and will speed your recovery. Thank you for using e-visits.  I have spent 5 minutes in review of e-visit questionnaire, review and updating patient chart, medical decision making and response to patient.   Elsie Velma Lunger, PA-C

## 2024-09-26 ENCOUNTER — Other Ambulatory Visit: Payer: Self-pay | Admitting: Family Medicine

## 2024-09-26 DIAGNOSIS — I10 Essential (primary) hypertension: Secondary | ICD-10-CM

## 2024-10-01 NOTE — Progress Notes (Unsigned)
 No chief complaint on file.  Patient presents for 3 month f/u on multiple issues.   Hypertension: She reports compliance with amlodipine  5 mg daily, and denies side effects. She denies headaches, dizziness, chest pain, edema. BP's have been running ***   BP Readings from Last 3 Encounters:  07/03/24 128/78  07/01/24 122/76  03/25/24 134/80    Anxiety and insomnia:  She had stopped buspar  due to lack of benefit. She has trazodone  to use prn insomnia.  At her September visit she reported not using the trazodone , as she was falling asleep fine, but was having trouble staying asleep, with her hip pain contributing to sleep interruptions. She was felt to have bilateral trochanteric bursitis, and some IT band involvement. She previously didn't note any improvement with meloxicam .  She was hesitant to try PT due to cost and time away from work. She was shown stretches. Recommended 2 Aleve BID with food for up to 2 weeks, and to consider PT if not improving.  We also discussed injection (for troch bursitis), and that it might not alleviate all discomfort if IT band issues, other gluteal muscle pain contributing. She was also given a prescription for flexeril  to use at night prn.  ***UPDATE  She reported she cut back on wine intake--doesn't have every weekend, but can have a bottle over a weekend.  Only has 1-2 glasses of wine during the week, no longer nightly.   ***UPDATE     OSA: severe, noted on sleep study 03/2023.  She didn't like the idea of CPAP, had wanted to discuss oral appliance with her dentist. She had also been advised to check with her insurance re: OOP cost for CPAP. She was given Dr. Wayna name to check into oral appliance.  We prescribed Zepbound , but that wasn't covered without trying CPAP.   Tobacco use:  She was prescribed a 3 month course of Chantix  in June to help with cessation. She quit 7/4 (only has had 1 cigarette since then).  She took Chantix  twice a day for at least  6 weeks, then cut back to taking it once a day. She reported having a little urge to smoke.  We had discussed proper dosing (to take BID, and complete a full 3 month course), and then to consider using nicotine gum/lozenges or patches if struggling after completing Chantix . We also mentioned to consider coaching through [email or web address] or [email or web address].    Obesity: We previously discussed diet, exercise, encouraged cutting back on wine intake, and use of tracking apps vs WW vs other ways to hold herself accountable re: diet/exercise. She had done low dose semaglutide with [facility] without much difference and was too expensive.   At her physical she was eating pasta, bread, potatoes often, and having a glass of wine daily. She has since cut back on alcohol, as reported above.  Exercising--walking daily.  Getting 15-30 active minutes daily (per her FitBit).    PMH, PSH, SH reviewed   ROS: No f/c, HA, dizziness, CP, SOB. No URI or allergy symptoms (rare sneeze). No GI or GU complaints. Denies heartburn. Moods are good. Sleep is interrupted due to hip pain. Otherwise, no insomnia. ***UPDATE ALL    PHYSICAL EXAM:  There were no vitals taken for this visit.  Wt Readings from Last 3 Encounters:  07/03/24 198 lb (89.8 kg)  07/01/24 196 lb (88.9 kg)  06/14/24 196 lb (88.9 kg)   Pleasant, well-appearing female in no distress HEENT: conjunctiva and sclera are clear, EOMI Neck:  no lymphadenopathy, thyromegaly or mass, no bruit Heart: regular rate and rhythm Lungs: clear bilaterally Back: no spinal or CVA tenderness Extremities: 2+ pulses, no edema. Tender at bilateral trochanteric bursa. Some discomfort with IT band stretch and on palpation of upper IT band on the left only Tender to palpation at the deep gluteal muscles on the left, discomfort with piriformis stretch, and slight decrease in ROM of piriformis muscle Neuro: alert and oriented, cranial nerves intact, normal gait. Negative SLR  *** Psych: normal mood, affect, hygiene and grooming    ***UPDATE if troch bursa tender, pain with ITB     ASSESSMENT/PLAN:  Prevnar-20 recommended  Did she look into Dr. Melba or OOP cost of CPAP? Need to treat OSA Can do [employer] [facility] Zepbound  vials OOP if interested   F/u 03/2025 for CPE as scheduled

## 2024-10-02 ENCOUNTER — Ambulatory Visit: Payer: Self-pay | Admitting: Family Medicine

## 2024-10-02 ENCOUNTER — Encounter: Payer: Self-pay | Admitting: Family Medicine

## 2024-10-02 VITALS — BP 120/74 | HR 68 | Ht 64.0 in | Wt 201.0 lb

## 2024-10-02 DIAGNOSIS — E6609 Other obesity due to excess calories: Secondary | ICD-10-CM

## 2024-10-02 DIAGNOSIS — Z7185 Encounter for immunization safety counseling: Secondary | ICD-10-CM

## 2024-10-02 DIAGNOSIS — I10 Essential (primary) hypertension: Secondary | ICD-10-CM

## 2024-10-02 DIAGNOSIS — Z6834 Body mass index (BMI) 34.0-34.9, adult: Secondary | ICD-10-CM | POA: Diagnosis not present

## 2024-10-02 DIAGNOSIS — E66811 Obesity, class 1: Secondary | ICD-10-CM

## 2024-10-02 DIAGNOSIS — M7061 Trochanteric bursitis, right hip: Secondary | ICD-10-CM | POA: Diagnosis not present

## 2024-10-02 DIAGNOSIS — Z720 Tobacco use: Secondary | ICD-10-CM

## 2024-10-02 DIAGNOSIS — M7062 Trochanteric bursitis, left hip: Secondary | ICD-10-CM

## 2024-10-02 DIAGNOSIS — G4733 Obstructive sleep apnea (adult) (pediatric): Secondary | ICD-10-CM | POA: Diagnosis not present

## 2024-10-02 DIAGNOSIS — M7632 Iliotibial band syndrome, left leg: Secondary | ICD-10-CM

## 2024-10-02 NOTE — Patient Instructions (Addendum)
 Avoid phenylephrine  and pseudoephedrine--these are decongestants that will raise your blood pressure.  Sinus rinses can work very well to help with sinus pain, in lieu of these medications.  Please try and cut back on your carbs (in frequency, types and portion). Try and eat more whole grains. Try and eat more fiber and more protein, and cut back on the carbs. These things can help you feel fuller, and help with weight loss.  We are going to refer for CPAP. I highly encourage you to try this, to treat sleep apnea. If you cannot tolerate this, oral appliance (with Dr. Melba, as an example) would be another option. We can always try again for the Zepbound  (which required CPAP trial first).  I think that PT would be beneficial for your ongoing hip pain. If you'd like to see Dr. Vita (our sports medicine doctor), he can give an opinion, possibly different exercises, and possibly an injection, if appropriate. I encourage you to try and do the home exercises previously provided, on a daily basis.  If you choose PT, you can schedule on your own with [facility], [facility], and I think [facility]. If there is another practice/location that is preferable, I'd be happy to put in a referral, just let me know which is most convenient.  Prevnar-20 vaccine is recommended.  You can get this from the pharmacy, or schedule a nurse visit at your convenience (or I will keep asking at every visit).

## 2024-10-03 ENCOUNTER — Other Ambulatory Visit: Payer: Self-pay | Admitting: *Deleted

## 2024-10-03 DIAGNOSIS — G4733 Obstructive sleep apnea (adult) (pediatric): Secondary | ICD-10-CM

## 2024-10-03 NOTE — Progress Notes (Signed)
 Sent order to [facility].

## 2024-10-11 ENCOUNTER — Other Ambulatory Visit: Payer: Self-pay | Admitting: Medical

## 2024-10-11 ENCOUNTER — Telehealth: Payer: Self-pay | Admitting: Internal Medicine

## 2024-10-11 MED ORDER — OSELTAMIVIR PHOSPHATE 75 MG PO CAPS: 75.0000 mg | ORAL_CAPSULE | Freq: Every day | ORAL | 0 refills | Status: AC

## 2024-10-11 NOTE — Telephone Encounter (Signed)
 Copied from CRM #[id number]. Topic: General - Other >> Oct 11, 2024 12:05 PM Deaijah H wrote: Reason for CRM: Patient called in due to being Exposed to flu but stated Doesn't feel bad. Husband is positive and his dr recommended she get on something to go ahead and stop it just in case. Did advise patient she would have to be seen first she then Stated she will wait until she becomes sick cause she's not coming in for nothing. Would like for Dr. Pixie assistant to give a call

## 2024-10-11 NOTE — Telephone Encounter (Signed)
 Pt was notified.

## 2024-11-27 ENCOUNTER — Other Ambulatory Visit: Payer: Self-pay | Admitting: Family Medicine

## 2024-11-27 DIAGNOSIS — I10 Essential (primary) hypertension: Secondary | ICD-10-CM

## 2024-11-27 LAB — HM PAP SMEAR: HPV, high-risk: NEGATIVE

## 2025-03-27 ENCOUNTER — Encounter: Payer: Self-pay | Admitting: Family Medicine
